# Patient Record
Sex: Female | Born: 1955 | Smoking: Former smoker
Health system: Southern US, Community
[De-identification: ages and names within clinical notes are randomized; demographics above are authoritative.]

## PROBLEM LIST (undated history)

## (undated) DIAGNOSIS — Z86718 Personal history of other venous thrombosis and embolism: Secondary | ICD-10-CM

## (undated) DIAGNOSIS — J449 Chronic obstructive pulmonary disease, unspecified: Secondary | ICD-10-CM

## (undated) DIAGNOSIS — J42 Unspecified chronic bronchitis: Secondary | ICD-10-CM

## (undated) DIAGNOSIS — J9819 Other pulmonary collapse: Secondary | ICD-10-CM

## (undated) DIAGNOSIS — Z9981 Dependence on supplemental oxygen: Secondary | ICD-10-CM

## (undated) DIAGNOSIS — I10 Essential (primary) hypertension: Secondary | ICD-10-CM

## (undated) DIAGNOSIS — N189 Chronic kidney disease, unspecified: Secondary | ICD-10-CM

## (undated) DIAGNOSIS — I509 Heart failure, unspecified: Secondary | ICD-10-CM

## (undated) DIAGNOSIS — T7840XA Allergy, unspecified, initial encounter: Secondary | ICD-10-CM

## (undated) DIAGNOSIS — M1990 Unspecified osteoarthritis, unspecified site: Secondary | ICD-10-CM

## (undated) HISTORY — DX: Allergy, unspecified, initial encounter: T78.40XA

## (undated) HISTORY — DX: Personal history of other venous thrombosis and embolism: Z86.718

## (undated) HISTORY — DX: Chronic kidney disease, unspecified: N18.9

## (undated) HISTORY — DX: Unspecified osteoarthritis, unspecified site: M19.90

## (undated) HISTORY — DX: Other pulmonary collapse: J98.19

## (undated) HISTORY — PX: TUBAL LIGATION: SHX77

## (undated) HISTORY — DX: Essential (primary) hypertension: I10

## (undated) HISTORY — DX: Dependence on supplemental oxygen: Z99.81

## (undated) HISTORY — DX: Heart failure, unspecified: I50.9

## (undated) HISTORY — DX: Chronic obstructive pulmonary disease, unspecified: J44.9

## (undated) HISTORY — DX: Unspecified chronic bronchitis: J42

---

## 1974-12-03 DIAGNOSIS — Z86718 Personal history of other venous thrombosis and embolism: Secondary | ICD-10-CM

## 1974-12-03 HISTORY — DX: Personal history of other venous thrombosis and embolism: Z86.718

## 2006-08-21 ENCOUNTER — Ambulatory Visit: Payer: Self-pay

## 2008-11-21 ENCOUNTER — Emergency Department: Payer: Self-pay | Admitting: Unknown Physician Specialty

## 2012-05-04 ENCOUNTER — Emergency Department: Payer: Self-pay | Admitting: Emergency Medicine

## 2012-05-04 LAB — CBC
HGB: 14.7 g/dL (ref 12.0–16.0)
MCHC: 33.6 g/dL (ref 32.0–36.0)
MCV: 97 fL (ref 80–100)
Platelet: 215 10*3/uL (ref 150–440)
RBC: 4.51 10*6/uL (ref 3.80–5.20)

## 2012-05-04 LAB — BASIC METABOLIC PANEL
Calcium, Total: 9 mg/dL (ref 8.5–10.1)
Chloride: 102 mmol/L (ref 98–107)
Creatinine: 0.62 mg/dL (ref 0.60–1.30)
EGFR (African American): >60
EGFR (Non-African Amer.): >60
Osmolality: 274 (ref 275–301)
Potassium: 4.6 mmol/L (ref 3.5–5.1)
Sodium: 137 mmol/L (ref 136–145)

## 2012-05-04 LAB — TROPONIN I: Troponin-I: <0.02 ng/mL

## 2012-05-07 DIAGNOSIS — R000 Tachycardia, unspecified: Secondary | ICD-10-CM

## 2012-05-08 ENCOUNTER — Telehealth: Payer: Self-pay

## 2012-05-08 NOTE — Telephone Encounter (Signed)
Attempted to reach pt re: Holter ordered in ER Unable to reach d/t all contact #'s being disconnected.

## 2012-06-13 ENCOUNTER — Ambulatory Visit (INDEPENDENT_AMBULATORY_CARE_PROVIDER_SITE_OTHER): Payer: Self-pay | Admitting: Cardiovascular Disease

## 2012-06-13 ENCOUNTER — Encounter: Payer: Self-pay | Admitting: Cardiovascular Disease

## 2012-06-13 VITALS — BP 130/80 | HR 80 | Ht 64.0 in | Wt 179.8 lb

## 2012-06-13 DIAGNOSIS — R002 Palpitations: Secondary | ICD-10-CM

## 2012-06-13 DIAGNOSIS — R079 Chest pain, unspecified: Secondary | ICD-10-CM

## 2012-06-13 DIAGNOSIS — F17200 Nicotine dependence, unspecified, uncomplicated: Secondary | ICD-10-CM | POA: Insufficient documentation

## 2012-06-13 MED ORDER — NITROGLYCERIN 0.4 MG SL SUBL: 0.4000 mg | SUBLINGUAL_TABLET | SUBLINGUAL | Status: DC | PRN

## 2012-06-13 MED ORDER — METOPROLOL TARTRATE 25 MG PO TABS: 25.0000 mg | ORAL_TABLET | Freq: Two times a day (BID) | ORAL | Status: DC

## 2012-06-13 NOTE — Assessment & Plan Note (Signed)
Holter monitor shows very short runs of SVT. She has improved symptoms with metoprolol. We have suggested she stay on the metoprolol tartrate 25 mg twice a day

## 2012-06-13 NOTE — Patient Instructions (Addendum)
You are doing well. Please continue metoprolol tartrate twice a day for palpitations Please call if you have worsening chest pain, more intense, lasting longer, more frequent, associated with sweating and dizziness  Please call us if you have new issues that need to be addressed before your next appt.  Your physician wants you to follow-up in: 6 months.  You will receive a reminder letter in the mail two months in advance. If you don't receive a letter, please call our office to schedule the follow-up appointment.

## 2012-06-13 NOTE — Assessment & Plan Note (Signed)
We have encouraged her to continue to work on weaning her cigarettes and smoking cessation. She will continue to work on this and does not want any assistance with chantix.  

## 2012-06-13 NOTE — Progress Notes (Signed)
Patient ID: Kellie Morris, female    DOB: 01/03/1956, 56 y.o.   MRN: [id number]  HPI Comments: Kellie Morris is a 56 year old woman with a long history of smoking from age 3 and continues to smoke one pack per day with no primary care physician who presented to the emergency room June 2 with palpitations and chest pain at rest. Workup was essentially negative with normal EKG and normal cardiac enzymes. Symptoms have been ongoing for several months.  She was started on metoprolol tartrate 25 mg twice a day in the emergency room and symptoms seem to have significantly improved. She has much improved palpitations and less chest pain. She had a Holter monitor performed through the emergency room that showed normal sinus rhythm with short runs of SVT. She has run out of her metoprolol.   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 With activity, she cannot reproduce her chest discomfort. Her symptoms are described as a gripping in her chest that lasts for several seconds at a time and comes on at rest. She's never had symptoms with activity. Gripping feeling is estimated at 6/10.  EKG shows normal sinus rhythm with rate 81 beats per minute with no significant ST or T wave changes      Outpatient Encounter Prescriptions as of 06/13/2012  Medication Sig Dispense Refill  . Aspirin-Salicylamide-Caffeine (BC HEADACHE POWDER PO) Take by mouth daily.      . metoprolol tartrate (LOPRESSOR) 25 MG tablet Take 1 tablet (25 mg total) by mouth 2 (two) times daily.  180 tablet  3  . Multiple Vitamin (MULTIVITAMIN) tablet Take 1 tablet by mouth daily.      . nitroGLYCERIN (NITROSTAT) 0.4 MG SL tablet Place 1 tablet (0.4 mg total) under the tongue every 5 (five) minutes as needed for chest pain.  25 tablet  6    Review of Systems  Constitutional: Negative.   HENT: Negative.   Eyes: Negative.   Respiratory: Negative.   Cardiovascular: Positive for chest pain and palpitations.    Gastrointestinal: Negative.   Musculoskeletal: Negative.   Skin: Negative.   Neurological: Negative.   Hematological: Negative.   Psychiatric/Behavioral: Negative.   All other systems reviewed and are negative.    BP 130/80  Pulse 80  Ht 5\' 4"  (1.626 m)  Wt 179 lb 12 oz (81.534 kg)  BMI 30.85 kg/m2  Physical Exam  Nursing note and vitals reviewed. Constitutional: She is oriented to person, place, and time. She appears well-developed and well-nourished.       Smells like smoke  HENT:  Head: Normocephalic.  Nose: Nose normal.  Mouth/Throat: Oropharynx is clear and moist.  Eyes: Conjunctivae are normal. Pupils are equal, round, and reactive to light.  Neck: Normal range of motion. Neck supple. No JVD present.  Cardiovascular: Normal rate, regular rhythm, S1 normal, S2 normal, normal heart sounds and intact distal pulses.  Exam reveals no gallop and no friction rub.   No murmur heard. Pulmonary/Chest: Effort normal and breath sounds normal. No respiratory distress. She has no wheezes. She has no rales. She exhibits no tenderness.  Abdominal: Soft. Bowel sounds are normal. She exhibits no distension. There is no tenderness.  Musculoskeletal: Normal range of motion. She exhibits no edema and no tenderness.  Lymphadenopathy:    She has no cervical adenopathy.  Neurological: She is alert and oriented to person, place, and time. Coordination normal.  Skin: Skin is warm and dry. No rash noted. No erythema.  Psychiatric: She has a normal mood and affect. Her behavior is normal. Judgment and thought content normal.         Assessment and Plan

## 2012-06-13 NOTE — Assessment & Plan Note (Signed)
Atypical type chest pain, coming on at rest. We have discussed serious treatment options with her including treadmill study ordered monitoring with medical management. She'll prefer to continue on metoprolol at this time and, since her symptoms get worse. We will continue metoprolol 25 mg twice a day. We have suggested she not add aspirin to her diet as she takes a lot of BC powders. If she has any chest pain with exertion, or diaphoresis, increasing frequency or intensity, she has been asked to call our office for further evaluation. She is certainly high risk of coronary artery disease

## 2012-07-22 ENCOUNTER — Emergency Department: Payer: Self-pay | Admitting: *Deleted

## 2013-05-07 ENCOUNTER — Ambulatory Visit (INDEPENDENT_AMBULATORY_CARE_PROVIDER_SITE_OTHER): Payer: Self-pay | Admitting: Cardiovascular Disease

## 2013-05-07 ENCOUNTER — Encounter: Payer: Self-pay | Admitting: Cardiovascular Disease

## 2013-05-07 VITALS — BP 114/78 | HR 73 | Ht 64.5 in | Wt 178.0 lb

## 2013-05-07 DIAGNOSIS — R0602 Shortness of breath: Secondary | ICD-10-CM

## 2013-05-07 DIAGNOSIS — E785 Hyperlipidemia, unspecified: Secondary | ICD-10-CM

## 2013-05-07 DIAGNOSIS — R002 Palpitations: Secondary | ICD-10-CM

## 2013-05-07 DIAGNOSIS — Z0000 Encounter for general adult medical examination without abnormal findings: Secondary | ICD-10-CM | POA: Insufficient documentation

## 2013-05-07 DIAGNOSIS — Z79899 Other long term (current) drug therapy: Secondary | ICD-10-CM

## 2013-05-07 DIAGNOSIS — F17200 Nicotine dependence, unspecified, uncomplicated: Secondary | ICD-10-CM

## 2013-05-07 MED ORDER — METOPROLOL TARTRATE 25 MG PO TABS: 25.0000 mg | ORAL_TABLET | Freq: Two times a day (BID) | ORAL | Status: DC

## 2013-05-07 NOTE — Assessment & Plan Note (Signed)
We will check her cholesterol today. Given her long smoking history, would favor aggressive lipid management. Would start a statin for high cholesterol.

## 2013-05-07 NOTE — Assessment & Plan Note (Signed)
We have encouraged her to continue to work on weaning her cigarettes and smoking cessation. She will continue to work on this and does not want any assistance with chantix.  

## 2013-05-07 NOTE — Assessment & Plan Note (Signed)
Rare palpitations. We'll continue her on current dose of metoprolol

## 2013-05-07 NOTE — Progress Notes (Signed)
Patient ID: Kellie Morris, female    DOB: 1956/02/24, 57 y.o.   MRN: [id number]  HPI Comments: Kellie Morris is a 57 year old woman with a long history of smoking from age 56 and continues to smoke one pack per day with no primary care physician who presented to the emergency room May 04 2012 with palpitations and chest pain at rest. Workup was essentially negative with normal EKG and normal cardiac enzymes. Symptoms had been ongoing for several months.  She was started on metoprolol tartrate 25 mg twice a day in the emergency room and symptoms seem to have significantly improved.  On her presentation today, she reports that she is doing well. She continues to smoke, denies any chest pain. Occasional palpitations "once in a while".  Walk a long way over the holiday weekend and was short of breath but typically she's not very active.  Prior Holter monitor performed through the emergency room that showed normal sinus rhythm with short runs of SVT.   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 Aspirin-Salicylamide-Caffeine (BC HEADACHE POWDER PO) Take by mouth daily.      . metoprolol tartrate (LOPRESSOR) 25 MG tablet Take 1 tablet (25 mg total) by mouth 2 (two) times daily.  180 tablet  3  . Multiple Vitamin (MULTIVITAMIN) tablet Take 1 tablet by mouth daily.      . nitroGLYCERIN (NITROSTAT) 0.4 MG SL tablet Place 1 tablet (0.4 mg total) under the tongue every 5 (five) minutes as needed for chest pain.  25 tablet  6   No facility-administered encounter medications on file as of 05/07/2013.    Review of Systems  Constitutional: Negative.   HENT: Negative.   Eyes: Negative.   Respiratory: Positive for shortness of breath.   Cardiovascular:  Positive for palpitations.  Gastrointestinal: Negative.   Musculoskeletal: Negative.   Skin: Negative.   Neurological: Negative.   Psychiatric/Behavioral: Negative.   All other systems reviewed and are negative.    BP 114/78  Pulse 73  Ht 5' 4.5" (1.638 m)  Wt 178 lb (80.74 kg)  BMI 30.09 kg/m2  Physical Exam  Nursing note and vitals reviewed. Constitutional: She is oriented to person, place, and time. She appears well-developed and well-nourished.  Smells like smoke  HENT:  Head: Normocephalic.  Nose: Nose normal.  Mouth/Throat: Oropharynx is clear and moist.  Eyes: Conjunctivae are normal. Pupils are equal, round, and reactive to light.  Neck: Normal range of motion. Neck supple. No JVD present.  Cardiovascular: Normal rate, regular rhythm, S1 normal, S2 normal, normal heart sounds and intact distal pulses.  Exam reveals no gallop and no friction rub.   No murmur heard. Pulmonary/Chest: Effort normal. No respiratory distress. She has decreased breath sounds. She has no wheezes. She has no rales. She exhibits no tenderness.  Abdominal: Soft. Bowel sounds are normal. She exhibits no distension. There is no tenderness.  Musculoskeletal: Normal range of motion. She exhibits no edema and no tenderness.  Lymphadenopathy:    She has no cervical adenopathy.  Neurological: She is alert and oriented to person, place, and time. Coordination normal.  Skin: Skin is warm and dry. No rash noted. No erythema.  Psychiatric: She has a  normal mood and affect. Her behavior is normal. Judgment and thought content normal.    Assessment and Plan

## 2013-05-07 NOTE — Patient Instructions (Addendum)
You are doing well. No medication changes were made.  Please call us if you have new issues that need to be addressed before your next appt.  Your physician wants you to follow-up in: 12 months.  You will receive a reminder letter in the mail two months in advance. If you don't receive a letter, please call our office to schedule the follow-up appointment. 

## 2013-05-08 LAB — LIPID PANEL
Chol/HDL Ratio: 2.7 ratio units (ref 0.0–4.4)
Cholesterol, Total: 172 mg/dL (ref 100–199)
LDL Calculated: 92 mg/dL (ref 0–99)

## 2013-05-08 LAB — HEPATIC FUNCTION PANEL
AST: 22 IU/L (ref 0–40)
Albumin: 4.6 g/dL (ref 3.5–5.5)
Total Bilirubin: 0.3 mg/dL (ref 0.0–1.2)
Total Protein: 7.3 g/dL (ref 6.0–8.5)

## 2013-05-18 ENCOUNTER — Other Ambulatory Visit: Payer: Self-pay

## 2013-05-18 MED ORDER — SIMVASTATIN 20 MG PO TABS: 20.0000 mg | ORAL_TABLET | Freq: Every day | ORAL | Status: DC

## 2013-05-18 NOTE — Telephone Encounter (Signed)
Refill sent for simvastatin 20 mg take 1/2 tablet for the first two weeks, then increase to the whole 20 mg tablet.

## 2013-05-21 ENCOUNTER — Telehealth: Payer: Self-pay | Admitting: *Deleted

## 2013-05-21 MED ORDER — SIMVASTATIN 20 MG PO TABS: 20.0000 mg | ORAL_TABLET | Freq: Every day | ORAL | Status: DC

## 2013-05-21 NOTE — Telephone Encounter (Signed)
She needs the generic form Simvastin called into [facility]

## 2013-05-21 NOTE — Telephone Encounter (Signed)
Done

## 2013-06-04 ENCOUNTER — Other Ambulatory Visit: Payer: Self-pay | Admitting: *Deleted

## 2013-06-04 MED ORDER — NITROGLYCERIN 0.4 MG SL SUBL: 0.4000 mg | SUBLINGUAL_TABLET | SUBLINGUAL | Status: AC | PRN

## 2013-06-04 MED ORDER — SIMVASTATIN 20 MG PO TABS: 20.0000 mg | ORAL_TABLET | Freq: Every day | ORAL | Status: DC

## 2013-06-04 NOTE — Telephone Encounter (Signed)
Patient wants generic

## 2014-05-07 ENCOUNTER — Encounter: Payer: Self-pay | Admitting: Cardiovascular Disease

## 2014-05-07 ENCOUNTER — Ambulatory Visit (INDEPENDENT_AMBULATORY_CARE_PROVIDER_SITE_OTHER): Payer: Self-pay | Admitting: Cardiovascular Disease

## 2014-05-07 VITALS — BP 134/82 | HR 61 | Ht 64.0 in | Wt 166.5 lb

## 2014-05-07 DIAGNOSIS — R002 Palpitations: Secondary | ICD-10-CM

## 2014-05-07 DIAGNOSIS — IMO0001 Reserved for inherently not codable concepts without codable children: Secondary | ICD-10-CM

## 2014-05-07 DIAGNOSIS — F17200 Nicotine dependence, unspecified, uncomplicated: Secondary | ICD-10-CM

## 2014-05-07 DIAGNOSIS — R079 Chest pain, unspecified: Secondary | ICD-10-CM

## 2014-05-07 DIAGNOSIS — R0602 Shortness of breath: Secondary | ICD-10-CM

## 2014-05-07 MED ORDER — LOVASTATIN 20 MG PO TABS: 20.0000 mg | ORAL_TABLET | Freq: Every day | ORAL | Status: AC

## 2014-05-07 MED ORDER — METOPROLOL TARTRATE 25 MG PO TABS: 25.0000 mg | ORAL_TABLET | Freq: Two times a day (BID) | ORAL | Status: DC

## 2014-05-07 NOTE — Progress Notes (Signed)
   Patient ID: Kellie Morris, female    DOB: 1956/11/07, 58 y.o.   MRN: [id number]  HPI Comments: Kellie Morris is a 58 year old woman with a long history of smoking from age 25 and continues to smoke one pack per day who presented to the emergency room May 04 2012 with palpitations and chest pain at rest. Workup was essentially negative with normal EKG and normal cardiac enzymes. Symptoms had been ongoing for several months. She was started on metoprolol tartrate 25 mg twice a day in the emergency room and symptoms seem to have significantly improved.  On her presentation today, she reports that she is doing well. She continues to smoke, denies any chest pain.   Prior Holter monitor performed through the emergency room that showed normal sinus rhythm with short runs of SVT.   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   EKG shows normal sinus rhythm with rate 61 beats per minute with no significant ST or T wave changes      Outpatient Encounter Prescriptions as of 05/07/2014  Medication Sig  . Aspirin-Salicylamide-Caffeine (BC HEADACHE POWDER PO) Take by mouth daily.  . metoprolol tartrate (LOPRESSOR) 25 MG tablet Take 1 tablet (25 mg total) by mouth 2 (two) times daily.  . Multiple Vitamin (MULTIVITAMIN) tablet Take 1 tablet by mouth daily.  . nitroGLYCERIN (NITROSTAT) 0.4 MG SL tablet Place 1 tablet (0.4 mg total) under the tongue every 5 (five) minutes as needed for chest pain.   Review of Systems  Constitutional: Negative.   HENT: Negative.   Eyes: Negative.   Respiratory: Positive for shortness of breath.   Cardiovascular: Negative.   Gastrointestinal: Negative.   Endocrine: Negative.   Musculoskeletal: Negative.   Skin: Negative.   Allergic/Immunologic: Negative.   Neurological: Negative.   Hematological: Negative.   Psychiatric/Behavioral: Negative.   All other systems reviewed and are negative.   BP 134/82  Pulse 61  Ht 5\' 4"  (1.626 m)  Wt 166 lb  8 oz (75.524 kg)  BMI 28.57 kg/m2  Physical Exam  Nursing note and vitals reviewed. Constitutional: She is oriented to person, place, and time. She appears well-developed and well-nourished.  Smells like smoke  HENT:  Head: Normocephalic.  Nose: Nose normal.  Mouth/Throat: Oropharynx is clear and moist.  Eyes: Conjunctivae are normal. Pupils are equal, round, and reactive to light.  Neck: Normal range of motion. Neck supple. No JVD present.  Cardiovascular: Normal rate, regular rhythm, S1 normal, S2 normal, normal heart sounds and intact distal pulses.  Exam reveals no gallop and no friction rub.   No murmur heard. Pulmonary/Chest: Effort normal. No respiratory distress. She has decreased breath sounds. She has no wheezes. She has no rales. She exhibits no tenderness.  Abdominal: Soft. Bowel sounds are normal. She exhibits no distension. There is no tenderness.  Musculoskeletal: Normal range of motion. She exhibits no edema and no tenderness.  Lymphadenopathy:    She has no cervical adenopathy.  Neurological: She is alert and oriented to person, place, and time. Coordination normal.  Skin: Skin is warm and dry. No rash noted. No erythema.  Psychiatric: She has a normal mood and affect. Her behavior is normal. Judgment and thought content normal.    Assessment and Plan

## 2014-05-07 NOTE — Assessment & Plan Note (Signed)
No further episodes of chest pain. No further workup at this time

## 2014-05-07 NOTE — Patient Instructions (Addendum)
You are doing well. Please start lovastatin, Cut in 1/2 daily  Please call us if you have new issues that need to be addressed before your next appt.  Your physician wants you to follow-up in: 12 months.  You will receive a reminder letter in the mail two months in advance. If you don't receive a letter, please call our office to schedule the follow-up appointment.

## 2014-05-07 NOTE — Assessment & Plan Note (Signed)
Sx resolved with metoprolol. She would like to continue on metoprolol and she is doing so well

## 2014-05-07 NOTE — Assessment & Plan Note (Signed)
We have encouraged her to continue to work on weaning her cigarettes and smoking cessation. She will continue to work on this and does not want any assistance with chantix.  

## 2015-03-26 DIAGNOSIS — F1010 Alcohol abuse, uncomplicated: Secondary | ICD-10-CM | POA: Insufficient documentation

## 2015-03-26 DIAGNOSIS — I4891 Unspecified atrial fibrillation: Secondary | ICD-10-CM | POA: Insufficient documentation

## 2015-04-02 DIAGNOSIS — E785 Hyperlipidemia, unspecified: Secondary | ICD-10-CM | POA: Insufficient documentation

## 2015-04-02 DIAGNOSIS — J9621 Acute and chronic respiratory failure with hypoxia: Secondary | ICD-10-CM | POA: Insufficient documentation

## 2015-04-02 DIAGNOSIS — J449 Chronic obstructive pulmonary disease, unspecified: Secondary | ICD-10-CM | POA: Insufficient documentation

## 2015-09-26 ENCOUNTER — Telehealth: Payer: Self-pay | Admitting: Cardiovascular Disease

## 2015-09-26 NOTE — Telephone Encounter (Signed)
Patient was treated at [facility] for afib and has been fu with them.  Does not wish to schedule OD fu with gollan   Deleting recall.

## 2015-12-29 DIAGNOSIS — G2581 Restless legs syndrome: Secondary | ICD-10-CM | POA: Insufficient documentation

## 2015-12-29 DIAGNOSIS — G4700 Insomnia, unspecified: Secondary | ICD-10-CM | POA: Insufficient documentation

## 2016-06-13 ENCOUNTER — Other Ambulatory Visit: Payer: Self-pay | Admitting: Cardiovascular Disease

## 2017-04-19 DIAGNOSIS — E785 Hyperlipidemia, unspecified: Secondary | ICD-10-CM | POA: Insufficient documentation

## 2017-12-04 DIAGNOSIS — R918 Other nonspecific abnormal finding of lung field: Secondary | ICD-10-CM | POA: Insufficient documentation

## 2017-12-14 ENCOUNTER — Other Ambulatory Visit: Payer: Self-pay

## 2017-12-14 ENCOUNTER — Ambulatory Visit: Payer: Self-pay

## 2017-12-14 ENCOUNTER — Ambulatory Visit
Admission: EM | Admit: 2017-12-14 | Discharge: 2017-12-14 | Disposition: A | Discharge status: Home / Self Care | Payer: Self-pay | Attending: Family Medicine | Admitting: Family Medicine

## 2017-12-14 DIAGNOSIS — R062 Wheezing: Secondary | ICD-10-CM

## 2017-12-14 DIAGNOSIS — J45901 Unspecified asthma with (acute) exacerbation: Secondary | ICD-10-CM

## 2017-12-14 DIAGNOSIS — B9789 Other viral agents as the cause of diseases classified elsewhere: Secondary | ICD-10-CM

## 2017-12-14 DIAGNOSIS — J069 Acute upper respiratory infection, unspecified: Secondary | ICD-10-CM

## 2017-12-14 DIAGNOSIS — R05 Cough: Secondary | ICD-10-CM

## 2017-12-14 MED ORDER — AZITHROMYCIN 250 MG PO TABS: 250.0000 mg | ORAL_TABLET | Freq: Every day | ORAL | 0 refills | Status: DC

## 2017-12-14 MED ORDER — IPRATROPIUM-ALBUTEROL 0.5-2.5 (3) MG/3ML IN SOLN
3.0000 mL | Freq: Four times a day (QID) | RESPIRATORY_TRACT | Status: DC
  Administered 2017-12-14: 3 mL via RESPIRATORY_TRACT

## 2017-12-14 MED ORDER — METHYLPREDNISOLONE SODIUM SUCC 125 MG IJ SOLR
125.0000 mg | Freq: Once | INTRAMUSCULAR | Status: AC
  Administered 2017-12-14: 125 mg via INTRAMUSCULAR

## 2017-12-14 MED ORDER — PREDNISONE 20 MG PO TABS: 40.0000 mg | ORAL_TABLET | Freq: Every day | ORAL | 0 refills | Status: DC

## 2017-12-14 NOTE — ED Triage Notes (Signed)
Patient c/o cough and congestion x 3 days.Patient states when she coughs her chest and ribs hurt.

## 2017-12-14 NOTE — ED Provider Notes (Signed)
[facility]    CSN: [id number] Arrival date & time: 12/14/17  1139     History   Chief Complaint Chief Complaint  Patient presents with  . Cough    HPI Kellie Morris is a 62 y.o. female presents to the urgent care facility for evaluation of wheezing, cough, congestion.  Symptoms been present for 4-5 days.  She has had some intermittent fevers.  She has a history of COPD.  Normally takes Spiriva and occasional albuterol inhaler as needed.  She has tried albuterol this morning with minimal relief.  Coughing is often productive.  She has had low-grade intermittent subjective fevers.  She denies any shortness of breath, chest pain, abdominal pain, nausea vomiting or diarrhea.  Patient has not been taking medications for symptoms.  HPI  Past Medical History:  Diagnosis Date  . Chronic bronchitis ([place])   . History of DVT (deep vein thrombosis) 1976  . Hypertension     Patient Active Problem List   Diagnosis Date Noted  . Visit for preventive health examination 05/07/2013  . Chest pain 06/13/2012  . Palpitations 06/13/2012  . Smoking 06/13/2012    Past Surgical History:  Procedure Laterality Date  . TUBAL LIGATION      OB History    No data available       Home Medications    Prior to Admission medications   Medication Sig Start Date End Date Taking? Authorizing Provider  Aspirin-Salicylamide-Caffeine (BC HEADACHE POWDER PO) Take by mouth daily.   Yes [provider]  gabapentin (NEURONTIN) 100 MG capsule Take 100 mg by mouth 3 (three) times daily.   Yes [provider]  lisinopril (PRINIVIL,ZESTRIL) 10 MG tablet Take 10 mg by mouth daily.   Yes [provider]  lovastatin (MEVACOR) 20 MG tablet Take 1 tablet (20 mg total) by mouth at bedtime. 05/07/14  Yes Minna Merritts, MD  metoprolol tartrate (LOPRESSOR) 25 MG tablet Take 1 tablet (25 mg total) by mouth 2 (two) times daily. 05/07/14  Yes Minna Merritts, MD  Multiple Vitamin  (MULTIVITAMIN) tablet Take 1 tablet by mouth daily.   Yes [provider]  nitroGLYCERIN (NITROSTAT) 0.4 MG SL tablet Place 1 tablet (0.4 mg total) under the tongue every 5 (five) minutes as needed for chest pain. 06/04/13 12/14/17 Yes Gollan, Kathlene November, MD  azithromycin (ZITHROMAX) 250 MG tablet Take 1 tablet (250 mg total) by mouth daily. Take first 2 tablets together, then 1 every day until finished. 12/14/17   Duanne Guess, PA-C  predniSONE (DELTASONE) 20 MG tablet Take 2 tablets (40 mg total) by mouth daily. 12/14/17   Duanne Guess, PA-C    Family History Family History  Problem Relation Age of Onset  . Heart attack Father   . Heart attack Brother     Social History Social History   Tobacco Use  . Smoking status: Current Every Day Smoker    Packs/day: 1.00    Years: 40.00    Pack years: 40.00    Types: Cigarettes  . Smokeless tobacco: Never Used  Substance Use Topics  . Alcohol use: Yes    Comment: daily  . Drug use: No     Allergies   Codeine   Review of Systems Review of Systems  Constitutional: Negative for fever.  HENT: Positive for congestion. Negative for sore throat and trouble swallowing.   Respiratory: Positive for cough and wheezing. Negative for shortness of breath.   Cardiovascular: Negative for chest pain.  Gastrointestinal: Negative for abdominal pain, diarrhea and vomiting.  Genitourinary: Negative for difficulty urinating, dysuria and urgency.  Musculoskeletal: Negative for back pain and myalgias.  Skin: Negative for rash.  Neurological: Negative for dizziness and headaches.     Physical Exam Triage Vital Signs ED Triage Vitals  Enc Vitals Group     BP 12/14/17 1200 114/67     Pulse Rate 12/14/17 1200 96     Resp --      Temp 12/14/17 1200 97.7 F (36.5 C)     Temp Source 12/14/17 1200 Oral     SpO2 12/14/17 1200 93 %     Weight 12/14/17 1200 172 lb (78 kg)     Height 12/14/17 1200 5\' 4"  (1.626 m)     Head Circumference --       Peak Flow --      Pain Score 12/14/17 1201 5     Pain Loc --      Pain Edu? --      Excl. in [place]? --    No data found.  Updated Vital Signs BP 114/67 (BP Location: Left Arm)   Pulse 96   Temp 97.7 F (36.5 C) (Oral)   Ht 5\' 4"  (1.626 m)   Wt 172 lb (78 kg)   SpO2 93%   BMI 29.52 kg/m   Visual Acuity Right Eye Distance:   Left Eye Distance:   Bilateral Distance:    Right Eye Near:   Left Eye Near:    Bilateral Near:     Physical Exam  Constitutional: She is oriented to person, place, and time. She appears well-developed and well-nourished.  HENT:  Head: Normocephalic and atraumatic.  Right Ear: External ear normal.  Left Ear: External ear normal.  Mouth/Throat: Oropharynx is clear and moist. No oropharyngeal exudate.  Eyes: Conjunctivae are normal.  Neck: Normal range of motion.  Cardiovascular: Normal rate.  Pulmonary/Chest: Effort normal. No respiratory distress. She has wheezes. She has no rales. She exhibits no tenderness.  No retractions.  No signs of respiratory distress.  Slight bilateral expiratory wheeze moderate bilateral wheezing.  Abdominal: Soft. She exhibits no distension. There is no tenderness.  Musculoskeletal: Normal range of motion.  Neurological: She is alert and oriented to person, place, and time.  Skin: Skin is warm. No rash noted.  Psychiatric: She has a normal mood and affect. Her behavior is normal. Thought content normal.     UC Treatments / Results  Labs (all labs ordered are listed, but only abnormal results are displayed) Labs Reviewed - No data to display  EKG  EKG Interpretation None       Radiology Dg Chest 2 View  Result Date: 12/14/2017 CLINICAL DATA:  Cough, wheezing EXAM: CHEST  2 VIEW COMPARISON:  05/04/2012 FINDINGS: Hyperinflation. Mild cardiomegaly. Lungs clear. No effusions or acute bony abnormality. IMPRESSION: Cardiomegaly, hyperinflation.  No active disease. Electronically Signed   By: Rolm Baptise M.D.    On: 12/14/2017 12:49    Procedures Procedures (including critical care time)  Medications Ordered in UC Medications  ipratropium-albuterol (DUONEB) 0.5-2.5 (3) MG/3ML nebulizer solution 3 mL (3 mLs Nebulization Given 12/14/17 1220)  methylPREDNISolone sodium succinate (SOLU-MEDROL) 125 mg/2 mL injection 125 mg (125 mg Intramuscular Given 12/14/17 1219)     Initial Impression / Assessment and Plan / UC Course  I have reviewed the triage vital signs and the nursing notes.  Pertinent labs & imaging results that were available during my care of the patient were reviewed  by me and considered in my medical decision making (see chart for details).     62 year old female with upper respiratory infection with COPD exacerbation.  She is given DuoNeb treatment, 125 mg Solu-Medrol IM which improved wheezing and air movement significant.  Chest x-ray showed evidence of pulmonary infiltrate.  She is given prescription for prednisone, will continue with over-the-counter medications as well as albuterol and Spiriva as prescribed.  She is given a prescription for azithromycin, I recommend she not take this medication at this time.  If patient's symptoms are still persistent in 3-4 days with no improvement would recommend she start antibiotic at this time.  Patient educated on signs and symptoms to return to the clinic for.  Final Clinical Impressions(s) / UC Diagnoses   Final diagnoses:  Viral upper respiratory tract infection  Moderate asthma with exacerbation, unspecified whether persistent    ED Discharge Orders        Ordered    predniSONE (DELTASONE) 20 MG tablet  Daily     12/14/17 1301    azithromycin (ZITHROMAX) 250 MG tablet  Daily     12/14/17 1301         Duanne Guess, [place] 12/14/17 1303

## 2017-12-14 NOTE — Discharge Instructions (Signed)
Please take medications as prescribed.  Return to the urgent care or emergency department for any shortness of breath, fevers, worsening cough or urgent changes in health.

## 2017-12-17 ENCOUNTER — Telehealth: Payer: Self-pay | Admitting: Emergency Medicine

## 2017-12-17 NOTE — Telephone Encounter (Signed)
Lest message to follow-up with patient regarding her recent visit at [facility].

## 2018-02-22 DIAGNOSIS — I519 Heart disease, unspecified: Secondary | ICD-10-CM | POA: Insufficient documentation

## 2018-03-05 DIAGNOSIS — Q2112 Patent foramen ovale: Secondary | ICD-10-CM | POA: Insufficient documentation

## 2018-03-19 DIAGNOSIS — I272 Pulmonary hypertension, unspecified: Secondary | ICD-10-CM | POA: Insufficient documentation

## 2018-05-23 ENCOUNTER — Ambulatory Visit (INDEPENDENT_AMBULATORY_CARE_PROVIDER_SITE_OTHER): Payer: Self-pay

## 2018-05-23 ENCOUNTER — Ambulatory Visit
Admission: EM | Admit: 2018-05-23 | Discharge: 2018-05-23 | Disposition: A | Discharge status: Home / Self Care | Payer: Self-pay | Attending: Emergency Medicine | Admitting: Emergency Medicine

## 2018-05-23 ENCOUNTER — Encounter: Payer: Self-pay | Admitting: Emergency Medicine

## 2018-05-23 ENCOUNTER — Other Ambulatory Visit: Payer: Self-pay

## 2018-05-23 DIAGNOSIS — M79671 Pain in right foot: Secondary | ICD-10-CM

## 2018-05-23 DIAGNOSIS — M25571 Pain in right ankle and joints of right foot: Secondary | ICD-10-CM

## 2018-05-23 DIAGNOSIS — M19072 Primary osteoarthritis, left ankle and foot: Secondary | ICD-10-CM

## 2018-05-23 MED ORDER — PREDNISONE 10 MG (21) PO TBPK: ORAL_TABLET | ORAL | 0 refills | Status: DC

## 2018-05-23 NOTE — ED Provider Notes (Signed)
[facility]    CSN: [id number] Arrival date & time: 05/23/18  4098     History   Chief Complaint Chief Complaint  Patient presents with  . Ankle Pain    right  . Foot Pain    right    HPI Kellie Morris is a 62 y.o. female.   62 year old female presents with right foot and ankle pain that started about 3 days ago. No distinct injury or fall. Started on top of right foot and now most of the pain is located on right lateral malleolus area. Area is swollen and tender. Unable to fully bear weight. Has tried applying ice with minimal relief. She has not taken any OTC medications for pain. No previous injury to area. No history of gout but has had a brief period of similar symptoms that resolved on own a few months ago. Other chronic health issues include HTN, A fib, hx of DVT, COPD, hyperlipidemia and currently on Metoprolol, Lisinopril, Lovastatin, Lasix, Neurontin and Xarelto as well as Atrovent and Nitroglycerin prn.   The history is provided by the patient.    Past Medical History:  Diagnosis Date  . Chronic bronchitis ([place])   . History of DVT (deep vein thrombosis) 1976  . Hypertension     Patient Active Problem List   Diagnosis Date Noted  . Visit for preventive health examination 05/07/2013  . Chest pain 06/13/2012  . Palpitations 06/13/2012  . Smoking 06/13/2012    Past Surgical History:  Procedure Laterality Date  . TUBAL LIGATION      OB History   None      Home Medications    Prior to Admission medications   Medication Sig Start Date End Date Taking? Authorizing Provider  furosemide (LASIX) 20 MG tablet Take by mouth. 08/06/17 06/14/18 Yes [provider]  lisinopril (PRINIVIL,ZESTRIL) 10 MG tablet Take 10 mg by mouth daily.   Yes [provider]  lovastatin (MEVACOR) 20 MG tablet Take 1 tablet (20 mg total) by mouth at bedtime. 05/07/14  Yes Minna Merritts, MD  metoprolol tartrate (LOPRESSOR) 25 MG tablet Take 1 tablet (25 mg  total) by mouth 2 (two) times daily. 05/07/14  Yes Minna Merritts, MD  Multiple Vitamin (MULTIVITAMIN) tablet Take 1 tablet by mouth daily.   Yes [provider]  rivaroxaban (XARELTO) 20 MG TABS tablet Take by mouth. 05/15/18 06/14/18 Yes [provider]  Aspirin-Salicylamide-Caffeine (BC HEADACHE POWDER PO) Take by mouth daily.    [provider]  gabapentin (NEURONTIN) 100 MG capsule Take 100 mg by mouth 3 (three) times daily.    [provider]  ipratropium (ATROVENT HFA) 17 MCG/ACT inhaler Inhale into the lungs.    [provider]  nitroGLYCERIN (NITROSTAT) 0.4 MG SL tablet Place 1 tablet (0.4 mg total) under the tongue every 5 (five) minutes as needed for chest pain. 06/04/13 12/14/17  Minna Merritts, MD  predniSONE (STERAPRED UNI-PAK 21 TAB) 10 MG (21) TBPK tablet Take 6 tabs by mouth daily for 2 days, then decrease by 1 tablet every 2 days until finished on day 12 05/23/18   Jennel Mara, Nicholes Stairs, NP    Family History Family History  Problem Relation Age of Onset  . Heart attack Father   . Heart attack Brother     Social History Social History   Tobacco Use  . Smoking status: Former Smoker    Packs/day: 1.00    Years: 40.00    Pack years:  40.00    Types: Cigarettes  . Smokeless tobacco: Never Used  Substance Use Topics  . Alcohol use: Yes    Comment: daily  . Drug use: No     Allergies   Codeine   Review of Systems Review of Systems  Constitutional: Negative for activity change, appetite change, chills, fatigue and fever.  Respiratory: Negative for cough, chest tightness, shortness of breath and wheezing.   Cardiovascular: Negative for chest pain and leg swelling.  Gastrointestinal: Negative for nausea and vomiting.  Musculoskeletal: Positive for arthralgias, gait problem and joint swelling. Negative for myalgias.  Skin: Negative for color change, rash and wound.  Neurological: Negative for dizziness, tremors, seizures,  syncope, weakness, light-headedness, numbness and headaches.  Hematological: Negative for adenopathy. Bruises/bleeds easily.  Psychiatric/Behavioral: Negative.      Physical Exam Triage Vital Signs ED Triage Vitals  Enc Vitals Group     BP 05/23/18 1029 114/80     Pulse Rate 05/23/18 1029 86     Resp 05/23/18 1029 16     Temp 05/23/18 1029 97.9 F (36.6 C)     Temp Source 05/23/18 1029 Oral     SpO2 05/23/18 1029 96 %     Weight 05/23/18 1026 182 lb (82.6 kg)     Height 05/23/18 1026 5\' 4"  (1.626 m)     Head Circumference --      Peak Flow --      Pain Score 05/23/18 1025 8     Pain Loc --      Pain Edu? --      Excl. in [place]? --    No data found.  Updated Vital Signs BP 114/80 (BP Location: Left Arm)   Pulse 86   Temp 97.9 F (36.6 C) (Oral)   Resp 16   Ht 5\' 4"  (1.626 m)   Wt 182 lb (82.6 kg)   SpO2 96%   BMI 31.24 kg/m   Visual Acuity Right Eye Distance:   Left Eye Distance:   Bilateral Distance:    Right Eye Near:   Left Eye Near:    Bilateral Near:     Physical Exam  Constitutional: She is oriented to person, place, and time. Vital signs are normal. She appears well-developed and well-nourished. She is cooperative. No distress.  HENT:  Head: Normocephalic and atraumatic.  Neck: Normal range of motion.  Cardiovascular: Normal rate.  Pulmonary/Chest: Effort normal.  Musculoskeletal: She exhibits tenderness.       Right ankle: She exhibits decreased range of motion and swelling. She exhibits no ecchymosis and normal pulse. Tenderness. Lateral malleolus tenderness found. Achilles tendon normal.       Right foot: There is decreased range of motion and tenderness. There is no swelling, normal capillary refill, no crepitus, no deformity and no laceration.       Feet:  Decreased range of motion of right ankle/foot, especially with flexion and rotation. Very tender over lateral malleolus. Swollen but no bruising present. No redness. Also tender along mid to  proximal metatarsals. No swelling or redness present. Good pulses and capillary refill. No numbness. No neuro deficits present.   Neurological: She is alert and oriented to person, place, and time.  Skin: Skin is warm, dry and intact. Capillary refill takes less than 2 seconds. No bruising, no ecchymosis and no petechiae noted. No erythema.  Psychiatric: She has a normal mood and affect. Her behavior is normal. Judgment and thought content normal.  Vitals reviewed.    UC Treatments /  Results  Labs (all labs ordered are listed, but only abnormal results are displayed) Labs Reviewed - No data to display  EKG None  Radiology Dg Ankle Complete Right  Result Date: 05/23/2018 CLINICAL DATA:  Ankle pain. EXAM: RIGHT ANKLE - COMPLETE 3+ VIEW COMPARISON:  05/23/2018. FINDINGS: Diffuse soft tissue swelling. Diffuse degenerative change. No acute bony abnormality. No evidence of fracture dislocation. IMPRESSION: Diffuse degenerative change.  No acute abnormality. Electronically Signed   By: Marcello Moores  Register   On: 05/23/2018 11:50   Dg Foot Complete Right  Result Date: 05/23/2018 CLINICAL DATA:  Right foot pain.  No recent prior. EXAM: RIGHT FOOT COMPLETE - 3+ VIEW FINDINGS: No acute bony or joint abnormality identified. No evidence of fracture or dislocation. Degenerative changes first MTP joint. IMPRESSION: Degenerative changes first MTP joint. No acute bony or joint abnormality. Electronically Signed   By: Marcello Moores  Register   On: 05/23/2018 11:49    Procedures Procedures (including critical care time)  Medications Ordered in UC Medications - No data to display  Initial Impression / Assessment and Plan / UC Course  I have reviewed the triage vital signs and the nursing notes.  Pertinent labs & imaging results that were available during my care of the patient were reviewed by me and considered in my medical decision making (see chart for details).    Reviewed x-ray findings with patient- no  acute fracture but degenerative changes (arthritis) present. No signs of DVT. Since patient on Xarelto, anti-inflammatory medication is contraindicated. Patient has tolerated Prednisone in the past. Will trial Prednisone 10mg  12 day taper as directed. Wear ace wrap for support. Elevate foot as much as possible. Apply warm compresses to area for comfort. May also take Tylenol 1000mg  every 8 hours as needed for pain. Follow-up with an Orthopedic ([facility]) next week if not improving.   Final Clinical Impressions(s) / UC Diagnoses   Final diagnoses:  Acute right ankle pain  Right foot pain  Degenerative joint disease of ankle and foot, left     Discharge Instructions     Recommend start Prednisone 10mg  tablets- take 6 tablets today and tomorrow and then decrease by 1 tablet every 2 days until finished on day 12. Recommend apply warm compresses to area for comfort. May wear ace wrap for support. Follow-up with an Orthopedic next week if pain does not improve.     ED Prescriptions    Medication Sig Dispense Auth. Provider   predniSONE (STERAPRED UNI-PAK 21 TAB) 10 MG (21) TBPK tablet Take 6 tabs by mouth daily for 2 days, then decrease by 1 tablet every 2 days until finished on day 12 42 tablet Aaren Krog, Nicholes Stairs, NP     Controlled Substance Prescriptions [place] Controlled Substance Registry consulted? Not Applicable   Katy Apo, NP 05/23/18 1621

## 2018-05-23 NOTE — Discharge Instructions (Addendum)
Recommend start Prednisone 10mg  tablets- take 6 tablets today and tomorrow and then decrease by 1 tablet every 2 days until finished on day 12. Recommend apply warm compresses to area for comfort. May wear ace wrap for support. Follow-up with an Orthopedic next week if pain does not improve.

## 2018-05-23 NOTE — ED Triage Notes (Signed)
Patient c/o pain in her right foot and ankle that started 3 days ago.  Patient denies injury or fall.

## 2019-02-21 ENCOUNTER — Ambulatory Visit (INDEPENDENT_AMBULATORY_CARE_PROVIDER_SITE_OTHER): Payer: Self-pay

## 2019-02-21 ENCOUNTER — Other Ambulatory Visit: Payer: Self-pay

## 2019-02-21 ENCOUNTER — Ambulatory Visit
Admission: EM | Admit: 2019-02-21 | Discharge: 2019-02-21 | Disposition: A | Discharge status: Home / Self Care | Payer: Self-pay | Attending: Family Medicine | Admitting: Family Medicine

## 2019-02-21 DIAGNOSIS — M25571 Pain in right ankle and joints of right foot: Secondary | ICD-10-CM

## 2019-02-21 DIAGNOSIS — S8391XA Sprain of unspecified site of right knee, initial encounter: Secondary | ICD-10-CM

## 2019-02-21 DIAGNOSIS — X501XXA Overexertion from prolonged static or awkward postures, initial encounter: Secondary | ICD-10-CM

## 2019-02-21 NOTE — Discharge Instructions (Addendum)
Ice. Elevate. Rest. Tylenol. Gradually increase weight bearing as tolerated.   Follow up with orthopedic this coming week for continued pain. See above to call to schedule.  Follow up with your primary care physician this week as needed. Return to Urgent care for new or worsening concerns.

## 2019-02-21 NOTE — ED Provider Notes (Signed)
[facility] ____________________________________________  Time seen: Approximately 4:22 PM  I have reviewed the triage vital signs and the nursing notes.   HISTORY  Chief Complaint No chief complaint on file.  HPI Kellie Morris is a 63 y.o. female presenting with daughter at bedside for evaluation of right lower leg pain after injury that occurred this past Wednesday.  Reports she dropped something beside her car, bent down to get it and in the process of getting up she twisted her knee into an awkward position as well as her ankle.  Reports pain since.  Has had difficulty walking, but has continued to tolerate weightbearing.  Denies paresthesias or pain radiation.  Has been applying ankle brace without much improvement.  Has been elevating and applying ice which helps some.  Denies other pain or injuries.  Reports otherwise doing well.  Denies chest pain or shortness of breath.  Physicians, [employer]: PCP    Past Medical History:  Diagnosis Date  . Chronic bronchitis ([place])   . History of DVT (deep vein thrombosis) 1976  . Hypertension     Patient Active Problem List   Diagnosis Date Noted  . Visit for preventive health examination 05/07/2013  . Chest pain 06/13/2012  . Palpitations 06/13/2012  . Smoking 06/13/2012    Past Surgical History:  Procedure Laterality Date  . TUBAL LIGATION       No current facility-administered medications for this encounter.   Current Outpatient Medications:  .  Aspirin-Salicylamide-Caffeine (BC HEADACHE POWDER PO), Take by mouth daily., Disp: , Rfl:  .  furosemide (LASIX) 20 MG tablet, Take by mouth., Disp: , Rfl:  .  gabapentin (NEURONTIN) 100 MG capsule, Take 100 mg by mouth 3 (three) times daily., Disp: , Rfl:  .  ipratropium (ATROVENT HFA) 17 MCG/ACT inhaler, Inhale into the lungs., Disp: , Rfl:  .  lisinopril (PRINIVIL,ZESTRIL) 10 MG tablet, Take 10 mg by mouth daily., Disp: , Rfl:  .  lovastatin (MEVACOR) 20 MG tablet,  Take 1 tablet (20 mg total) by mouth at bedtime., Disp: 90 tablet, Rfl: 3 .  metoprolol tartrate (LOPRESSOR) 25 MG tablet, Take 1 tablet (25 mg total) by mouth 2 (two) times daily., Disp: 180 tablet, Rfl: 3 .  Multiple Vitamin (MULTIVITAMIN) tablet, Take 1 tablet by mouth daily., Disp: , Rfl:  .  nitroGLYCERIN (NITROSTAT) 0.4 MG SL tablet, Place 1 tablet (0.4 mg total) under the tongue every 5 (five) minutes as needed for chest pain., Disp: 25 tablet, Rfl: 6 .  predniSONE (STERAPRED UNI-PAK 21 TAB) 10 MG (21) TBPK tablet, Take 6 tabs by mouth daily for 2 days, then decrease by 1 tablet every 2 days until finished on day 12, Disp: 42 tablet, Rfl: 0 .  rivaroxaban (XARELTO) 20 MG TABS tablet, Take by mouth., Disp: , Rfl:   Allergies Codeine  Family History  Problem Relation Age of Onset  . Heart attack Father   . Heart attack Brother     Social History Social History   Tobacco Use  . Smoking status: Former Smoker    Packs/day: 1.00    Years: 40.00    Pack years: 40.00    Types: Cigarettes  . Smokeless tobacco: Never Used  Substance Use Topics  . Alcohol use: Yes    Comment:  daily 1/2 pint liquor  . Drug use: No    Review of Systems Constitutional: No fever Cardiovascular: Denies chest pain. Respiratory: Denies shortness of breath. Musculoskeletal: Negative for back pain.  Positive right  leg pain. Skin: Negative for rash.   ____________________________________________   PHYSICAL EXAM:  VITAL SIGNS: ED Triage Vitals  Enc Vitals Group     BP 02/21/19 1605 95/62     Pulse Rate 02/21/19 1605 88     Resp 02/21/19 1605 20     Temp 02/21/19 1605 97.6 F (36.4 C)     Temp Source 02/21/19 1605 Oral     SpO2 02/21/19 1605 98 %     Weight 02/21/19 1606 190 lb (86.2 kg)     Height 02/21/19 1606 5\' 4"  (1.626 m)     Head Circumference --      Peak Flow --      Pain Score 02/21/19 1606 10     Pain Loc --      Pain Edu? --      Excl. in [place]? --     Constitutional: Alert  and oriented. Well appearing and in no acute distress.      Head: Normocephalic and atraumatic. Cardiovascular: Normal rate, regular rhythm. Grossly normal heart sounds.  Good peripheral circulation. Respiratory: Normal respiratory effort without tachypnea nor retractions. Breath sounds are clear and equal bilaterally. No wheezes, rales, rhonchi. Musculoskeletal:   Bilateral pedal pulses equal and easily palpated.  Gait not tested. Except: Right medial knee minimal ecchymosis noted, mild diffuse tenderness, able to fully extend as well as flex, no pain with anterior posterior drawer, no medial or lateral instability noted, full range of motion present. Except: Right distal fibula tenderness to palpation down to lateral malleolus, mild medial malleolus tenderness palpation, minimal lateral edema, no ecchymosis, pain with ankle range of motion, minimal dorsal midfoot tenderness to palpation, right lower extremity otherwise nontender.  Right lower foot normal distal sensation and capillary refill. Neurologic:  Normal speech and language. No gross focal neurologic deficits are appreciated. Skin:  Skin is warm, dry and intact. No rash noted. Psychiatric: Mood and affect are normal. Speech and behavior are normal. Patient exhibits appropriate insight and judgment   ___________________________________________   LABS (all labs ordered are listed, but only abnormal results are displayed)   RADIOLOGY  Dg Tibia/fibula Right  Result Date: 02/21/2019 CLINICAL DATA:  RIGHT leg trauma. EXAM: RIGHT TIBIA AND FIBULA - 2 VIEW COMPARISON:  None. FINDINGS: There is no evidence of fracture or other focal bone lesions. Soft tissues are unremarkable. IMPRESSION: Negative. Electronically Signed   By: Franki Cabot M.D.   On: 02/21/2019 16:54   Dg Knee Complete 4 Views Right  Result Date: 02/21/2019 CLINICAL DATA:  RIGHT leg trauma. EXAM: RIGHT KNEE - COMPLETE 4+ VIEW COMPARISON:  None. FINDINGS: No evidence of  fracture, dislocation, or joint effusion. No evidence of arthropathy or other focal bone abnormality. Soft tissues are unremarkable. IMPRESSION: Negative. Electronically Signed   By: Franki Cabot M.D.   On: 02/21/2019 16:53   Dg Foot Complete Right  Result Date: 02/21/2019 CLINICAL DATA:  RIGHT lower leg trauma, pain to top of foot. EXAM: RIGHT FOOT COMPLETE - 3+ VIEW COMPARISON:  None. FINDINGS: There is no evidence of fracture or dislocation. There is no evidence of arthropathy or other focal bone abnormality. Soft tissues are unremarkable. IMPRESSION: Negative. Electronically Signed   By: Franki Cabot M.D.   On: 02/21/2019 16:54   ____________________________________________   PROCEDURES Procedures    INITIAL IMPRESSION / ASSESSMENT AND PLAN / ED COURSE  Pertinent labs & imaging results that were available during my care of the patient were reviewed by me and considered in  my medical decision making (see chart for details).  Overall well-appearing patient.  No acute distress.  Right leg pain after mechanical injury.  X-rays as above per radiologist, negative.  Suspect sprain injuries.  Encourage supportive care, rest, ice, elevation and Tylenol.  Gradually increase activity as tolerated.  Discussed follow-up with orthopedic this coming week for any continued pain.  Discussed follow up with Primary care physician this week. Discussed follow up and return parameters including no resolution or any worsening concerns. Patient verbalized understanding and agreed to plan.   ____________________________________________   FINAL CLINICAL IMPRESSION(S) / ED DIAGNOSES  Final diagnoses:  Sprain of right knee, unspecified ligament, initial encounter  Acute right ankle pain     ED Discharge Orders    None       Note: This dictation was prepared with Dragon dictation along with smaller phrase technology. Any transcriptional errors that result from this process are unintentional.          Marylene Land, NP 02/21/19 1703

## 2019-02-21 NOTE — ED Triage Notes (Addendum)
Pt was picking something up off the ground in the car on a kneeling position and twisted her right knee and ankle and fell to the floor. Pt states her blood pressure medicine might be too much as she has had a few falls over the past few months. Also drinks liquor daily.

## 2019-03-11 DIAGNOSIS — I50812 Chronic right heart failure: Secondary | ICD-10-CM | POA: Insufficient documentation

## 2019-06-29 ENCOUNTER — Encounter: Payer: Self-pay | Admitting: Emergency Medicine

## 2019-06-29 ENCOUNTER — Other Ambulatory Visit: Payer: Self-pay

## 2019-06-29 ENCOUNTER — Ambulatory Visit (INDEPENDENT_AMBULATORY_CARE_PROVIDER_SITE_OTHER): Payer: Self-pay

## 2019-06-29 ENCOUNTER — Telehealth: Payer: Self-pay | Admitting: Emergency Medicine

## 2019-06-29 ENCOUNTER — Ambulatory Visit
Admission: EM | Admit: 2019-06-29 | Discharge: 2019-06-29 | Disposition: A | Discharge status: Home / Self Care | Payer: Self-pay | Attending: Emergency Medicine | Admitting: Emergency Medicine

## 2019-06-29 DIAGNOSIS — L03115 Cellulitis of right lower limb: Secondary | ICD-10-CM

## 2019-06-29 DIAGNOSIS — M79671 Pain in right foot: Secondary | ICD-10-CM

## 2019-06-29 DIAGNOSIS — M25571 Pain in right ankle and joints of right foot: Secondary | ICD-10-CM

## 2019-06-29 DIAGNOSIS — M109 Gout, unspecified: Secondary | ICD-10-CM

## 2019-06-29 LAB — CBC WITH DIFFERENTIAL/PLATELET
Abs Immature Granulocytes: 0.04 10*3/uL (ref 0.00–0.07)
Basophils Absolute: 0 10*3/uL (ref 0.0–0.1)
Basophils Relative: 0 %
Eosinophils Absolute: 0 10*3/uL (ref 0.0–0.5)
Eosinophils Relative: 0 %
HCT: 37.3 % (ref 36.0–46.0)
Hemoglobin: 13 g/dL (ref 12.0–15.0)
Immature Granulocytes: 0 %
Lymphocytes Relative: 11 %
Lymphs Abs: 1.2 10*3/uL (ref 0.7–4.0)
MCH: 38.8 pg — ABNORMAL HIGH (ref 26.0–34.0)
MCHC: 34.9 g/dL (ref 30.0–36.0)
MCV: 111.3 fL — ABNORMAL HIGH (ref 80.0–100.0)
Monocytes Absolute: 0.7 10*3/uL (ref 0.1–1.0)
Monocytes Relative: 7 %
Neutro Abs: 8.8 10*3/uL — ABNORMAL HIGH (ref 1.7–7.7)
Neutrophils Relative %: 82 %
Platelets: 262 10*3/uL (ref 150–400)
RBC: 3.35 MIL/uL — ABNORMAL LOW (ref 3.87–5.11)
RDW: 12.2 % (ref 11.5–15.5)
WBC: 10.8 10*3/uL — ABNORMAL HIGH (ref 4.0–10.5)
nRBC: 0 % (ref 0.0–0.2)

## 2019-06-29 LAB — URIC ACID: Uric Acid, Serum: 12 mg/dL — ABNORMAL HIGH (ref 2.5–7.1)

## 2019-06-29 MED ORDER — CEPHALEXIN 500 MG PO CAPS: 500.0000 mg | ORAL_CAPSULE | Freq: Four times a day (QID) | ORAL | 0 refills | Status: DC

## 2019-06-29 MED ORDER — PREDNISONE 10 MG (21) PO TBPK: ORAL_TABLET | ORAL | 1 refills | Status: DC

## 2019-06-29 NOTE — Telephone Encounter (Signed)
Contacted patient per Jenita Seashore, NP and notified her that Uric Acid was 12.0. Patient was notified of results and to follow up with her PCP. Patient verbalized understanding and had no further questions or concerns.

## 2019-06-29 NOTE — ED Triage Notes (Signed)
Patient c/o right foot pain and swelling that started 2 days ago. Denies injury.

## 2019-06-29 NOTE — Discharge Instructions (Signed)
DO NOT TAKE IBUPROFEN OR BC POWDERS FOR PAIN.   TYLENOL ONLY.

## 2019-06-29 NOTE — ED Provider Notes (Signed)
[facility]    CSN: [id number] Arrival date & time: 06/29/19  1358      History   Chief Complaint Chief Complaint  Patient presents with  . Foot Pain    HPI Kellie Morris is a 63 y.o. female presenting with pain to right foot and ankle for 3 days. Per pt and pt's daughter, pt has chronic pain to this extremity and has occasional "flares" of increased pain. This episode however is so painful that she is unable to walk or press a gas pedal. No previous injuries to this foot/ankle and no previous known inflammatory disorder. Pt has been treating pain with ibuprofen 4 times a day. Pain is currently a constant 8/10 with no alleviating factors.   Past Medical History:  Diagnosis Date  . Chronic bronchitis ([place])   . History of DVT (deep vein thrombosis) 1976  . Hypertension     Patient Active Problem List   Diagnosis Date Noted  . Visit for preventive health examination 05/07/2013  . Chest pain 06/13/2012  . Palpitations 06/13/2012  . Smoking 06/13/2012    Past Surgical History:  Procedure Laterality Date  . TUBAL LIGATION      OB History   No obstetric history on file.      Home Medications    Prior to Admission medications   Medication Sig Start Date End Date Taking? Authorizing Provider  Aspirin-Salicylamide-Caffeine (BC HEADACHE POWDER PO) Take by mouth daily.   Yes [provider]  furosemide (LASIX) 20 MG tablet Take by mouth. 08/06/17 06/29/19 Yes [provider]  gabapentin (NEURONTIN) 100 MG capsule Take 100 mg by mouth 3 (three) times daily.   Yes [provider]  ipratropium (ATROVENT HFA) 17 MCG/ACT inhaler Inhale into the lungs.   Yes [provider]  lisinopril (PRINIVIL,ZESTRIL) 10 MG tablet Take 10 mg by mouth daily.   Yes [provider]  lovastatin (MEVACOR) 20 MG tablet Take 1 tablet (20 mg total) by mouth at bedtime. 05/07/14  Yes Minna Merritts, MD  metoprolol tartrate (LOPRESSOR) 25 MG tablet  Take 1 tablet (25 mg total) by mouth 2 (two) times daily. 05/07/14  Yes Minna Merritts, MD  Multiple Vitamin (MULTIVITAMIN) tablet Take 1 tablet by mouth daily.   Yes [provider]  rivaroxaban (XARELTO) 20 MG TABS tablet Take by mouth. 05/15/18 06/29/19 Yes [provider]  cephALEXin (KEFLEX) 500 MG capsule Take 1 capsule (500 mg total) by mouth 4 (four) times daily. 06/29/19   Gertie Baron, NP  nitroGLYCERIN (NITROSTAT) 0.4 MG SL tablet Place 1 tablet (0.4 mg total) under the tongue every 5 (five) minutes as needed for chest pain. 06/04/13 12/14/17  Minna Merritts, MD  predniSONE (STERAPRED UNI-PAK 21 TAB) 10 MG (21) TBPK tablet Take as instructed on package (60, 50, 40, 30, 20, 10) 06/29/19   Gertie Baron, NP    Family History Family History  Problem Relation Age of Onset  . Heart attack Father   . Heart attack Brother     Social History Social History   Tobacco Use  . Smoking status: Former Smoker    Packs/day: 1.00    Years: 40.00    Pack years: 40.00    Types: Cigarettes  . Smokeless tobacco: Never Used  Substance Use Topics  . Alcohol use: Yes    Comment:  daily 1/2 pint liquor  . Drug use: No     Allergies   Codeine   Review of Systems Review of  Systems  Musculoskeletal: Positive for arthralgias, gait problem, joint swelling and myalgias.  Skin: Positive for color change.  All other systems reviewed and are negative.    Physical Exam Triage Vital Signs ED Triage Vitals [06/29/19 1415]  Enc Vitals Group     BP 99/60     Pulse Rate (!) 102     Resp 18     Temp 98.4 F (36.9 C)     Temp Source Oral     SpO2 95 %     Weight 185 lb (83.9 kg)     Height 5\' 4"  (1.626 m)     Head Circumference      Peak Flow      Pain Score 10     Pain Loc      Pain Edu?      Excl. in [place]?    No data found.  Updated Vital Signs BP 99/60 (BP Location: Right Arm)   Pulse (!) 102   Temp 98.4 F (36.9 C) (Oral)   Resp 18   Ht 5\' 4"  (1.626 m)    Wt 185 lb (83.9 kg)   SpO2 95%   BMI 31.76 kg/m   Visual Acuity Right Eye Distance:   Left Eye Distance:   Bilateral Distance:    Right Eye Near:   Left Eye Near:    Bilateral Near:     Physical Exam Vitals signs and nursing note reviewed.  Constitutional:      General: She is not in acute distress.    Appearance: She is well-developed.  HENT:     Head: Normocephalic and atraumatic.  Eyes:     Conjunctiva/sclera: Conjunctivae normal.  Neck:     Musculoskeletal: Neck supple.  Cardiovascular:     Rate and Rhythm: Normal rate and regular rhythm.     Pulses:          Dorsalis pedis pulses are 2+ on the right side and 2+ on the left side.       Posterior tibial pulses are 2+ on the right side and 2+ on the left side.     Heart sounds: No murmur.  Pulmonary:     Effort: Pulmonary effort is normal. No respiratory distress.     Breath sounds: Normal breath sounds.  Abdominal:     Palpations: Abdomen is soft.     Tenderness: There is no abdominal tenderness.  Feet:     Right foot:     Skin integrity: Erythema, warmth and dry skin present.     Comments: Tenderness to 1st MTP and along 5th metatarsal. Pain is worse with pedal flexion and internal rotation.  Skin:    General: Skin is warm and dry.  Neurological:     Mental Status: She is alert.      UC Treatments / Results  Labs (all labs ordered are listed, but only abnormal results are displayed) Labs Reviewed  CBC WITH DIFFERENTIAL/PLATELET - Abnormal; Notable for the following components:      Result Value   WBC 10.8 (*)    RBC 3.35 (*)    MCV 111.3 (*)    MCH 38.8 (*)    Neutro Abs 8.8 (*)    All other components within normal limits  URIC ACID - Abnormal; Notable for the following components:   Uric Acid, Serum 12.0 (*)    All other components within normal limits    EKG   Radiology Dg Ankle Complete Right  Result Date: 06/29/2019 CLINICAL DATA:  Lateral ankle pain 3 days. EXAM: RIGHT ANKLE -  COMPLETE 3+ VIEW COMPARISON:  02/21/2019 FINDINGS: There is no evidence of fracture, dislocation, or joint effusion. There is no evidence of arthropathy or other focal bone abnormality. Soft tissues are unremarkable. IMPRESSION: Negative. Electronically Signed   By: Franchot Gallo M.D.   On: 06/29/2019 15:45   Dg Foot Complete Right  Result Date: 06/29/2019 CLINICAL DATA:  Pain and swelling.  Redness. EXAM: RIGHT FOOT COMPLETE - 3+ VIEW COMPARISON:  02/21/2019. FINDINGS: No acute bony or joint abnormality. No evidence of fracture or dislocation. No radiopaque foreign body. IMPRESSION: No acute abnormality. Electronically Signed   By: Marcello Moores  Register   On: 06/29/2019 15:46    Procedures Procedures (including critical care time)  Medications Ordered in UC Medications - No data to display  Initial Impression / Assessment and Plan / UC Course  I have reviewed the triage vital signs and the nursing notes.  Pertinent labs & imaging results that were available during my care of the patient were reviewed by me and considered in my medical decision making (see chart for details).     Pt presents with right foot pain, diagnosed with gout and cellulitis and treated as such with the prescribed medications below. Pt to follow-up with PCP ASAP for further management. Pt and daughter agreed. All questions answered and all concerns addressed.   Final Clinical Impressions(s) / UC Diagnoses   Final diagnoses:  Right foot pain  Cellulitis of right foot     Discharge Instructions     DO NOT TAKE IBUPROFEN OR BC POWDERS FOR PAIN.   TYLENOL ONLY.    ED Prescriptions    Medication Sig Dispense Auth. Provider   predniSONE (STERAPRED UNI-PAK 21 TAB) 10 MG (21) TBPK tablet Take as instructed on package (60, 50, 40, 30, 20, 10) 1 each Gertie Baron, NP   cephALEXin (KEFLEX) 500 MG capsule Take 1 capsule (500 mg total) by mouth 4 (four) times daily. 28 capsule Gertie Baron, NP     Controlled  Substance Prescriptions [place] Controlled Substance Registry consulted? Not Applicable    Gertie Baron, DNP, NP-c    Gertie Baron, NP 06/29/19 1650

## 2019-09-05 ENCOUNTER — Ambulatory Visit
Admission: EM | Admit: 2019-09-05 | Discharge: 2019-09-05 | Disposition: A | Discharge status: Home / Self Care | Payer: Self-pay | Attending: Internal Medicine | Admitting: Internal Medicine

## 2019-09-05 ENCOUNTER — Other Ambulatory Visit: Payer: Self-pay

## 2019-09-05 ENCOUNTER — Ambulatory Visit (INDEPENDENT_AMBULATORY_CARE_PROVIDER_SITE_OTHER): Payer: Self-pay

## 2019-09-05 ENCOUNTER — Encounter: Payer: Self-pay | Admitting: Emergency Medicine

## 2019-09-05 DIAGNOSIS — S39012A Strain of muscle, fascia and tendon of lower back, initial encounter: Secondary | ICD-10-CM

## 2019-09-05 DIAGNOSIS — S22000A Wedge compression fracture of unspecified thoracic vertebra, initial encounter for closed fracture: Secondary | ICD-10-CM

## 2019-09-05 DIAGNOSIS — R52 Pain, unspecified: Secondary | ICD-10-CM

## 2019-09-05 DIAGNOSIS — W19XXXA Unspecified fall, initial encounter: Secondary | ICD-10-CM

## 2019-09-05 DIAGNOSIS — W01190A Fall on same level from slipping, tripping and stumbling with subsequent striking against furniture, initial encounter: Secondary | ICD-10-CM

## 2019-09-05 MED ORDER — TRAMADOL HCL 50 MG PO TABS: 50.0000 mg | ORAL_TABLET | Freq: Four times a day (QID) | ORAL | 0 refills | Status: DC | PRN

## 2019-09-05 NOTE — Discharge Instructions (Addendum)
FOLLOW UP WITH YOUR PRIMARY CARE PROVIDER IN 7-10 DAYS OR SCHEDULED APPOINTMENT IF THEY CANT WORK YOU IN OR PREFERS TO SEE YOU ON DATE IT IS ALREADY SCHEDULED.

## 2019-09-05 NOTE — ED Provider Notes (Signed)
[facility]    CSN: [id number] Arrival date & time: 09/05/19  1157      History   Chief Complaint Chief Complaint  Patient presents with  . Rib Pain  . Fall  . Facial Injury    HPI Kellie Morris is a 63 y.o. female. who presents with injuries from a fall from 6 days ago. Her cat jumped in front of her and made her trip and she fell on her R side, hitting her lower R jaw on the edge of the couch. She thought she would just be sore and wound get better, so did not seek care, but her pain is getting worse. She states she heard 3 loud snaps on her rib area when she fell. Has hx of fractured ribs on both sides in 1988. Her pain is also located on her lower back and sides of her mid ribs. She denies LOC. Her neck only hurts on the R side for 3 days, but has resolved. Denies R arm or leg pain.  In the past was told she has signs of osteoporosis     Past Medical History:  Diagnosis Date  . Chronic bronchitis ([place])   . History of DVT (deep vein thrombosis) 1976  . Hypertension     Patient Active Problem List   Diagnosis Date Noted  . Visit for preventive health examination 05/07/2013  . Chest pain 06/13/2012  . Palpitations 06/13/2012  . Smoking 06/13/2012    Past Surgical History:  Procedure Laterality Date  . TUBAL LIGATION      OB History   No obstetric history on file.      Home Medications    Prior to Admission medications   Medication Sig Start Date End Date Taking? Authorizing Provider  furosemide (LASIX) 20 MG tablet Take by mouth. 08/06/17 09/05/19 Yes [provider]  gabapentin (NEURONTIN) 100 MG capsule Take 100 mg by mouth 3 (three) times daily.   Yes [provider]  lisinopril (PRINIVIL,ZESTRIL) 10 MG tablet Take 10 mg by mouth daily.   Yes [provider]  lovastatin (MEVACOR) 20 MG tablet Take 1 tablet (20 mg total) by mouth at bedtime. 05/07/14  Yes Minna Merritts, MD  metoprolol tartrate (LOPRESSOR) 25 MG tablet  Take 1 tablet (25 mg total) by mouth 2 (two) times daily. 05/07/14  Yes Minna Merritts, MD  Multiple Vitamin (MULTIVITAMIN) tablet Take 1 tablet by mouth daily.   Yes [provider]  rivaroxaban (XARELTO) 20 MG TABS tablet Take by mouth. 05/15/18 09/05/19 Yes [provider]  Aspirin-Salicylamide-Caffeine (BC HEADACHE POWDER PO) Take by mouth daily.    [provider]  ipratropium (ATROVENT HFA) 17 MCG/ACT inhaler Inhale into the lungs.    [provider]  nitroGLYCERIN (NITROSTAT) 0.4 MG SL tablet Place 1 tablet (0.4 mg total) under the tongue every 5 (five) minutes as needed for chest pain. 06/04/13 12/14/17  Minna Merritts, MD  traMADol (ULTRAM) 50 MG tablet Take 1 tablet (50 mg total) by mouth every 6 (six) hours as needed. 09/05/19   Rodriguez-Southworth, Sunday Spillers, PA-C    Family History Family History  Problem Relation Age of Onset  . Heart attack Father   . Heart attack Brother     Social History Social History   Tobacco Use  . Smoking status: Former Smoker    Packs/day: 1.00    Years: 40.00    Pack years: 40.00    Types: Cigarettes  . Smokeless tobacco: Never Used  Substance Use Topics  . Alcohol use: Yes    Comment:  daily 1/2 pint liquor  . Drug use: No     Allergies   Codeine   Review of Systems Review of Systems  Constitutional: Positive for fatigue. Negative for chills, diaphoresis and fever.  Respiratory: Negative for cough, chest tightness and shortness of breath.   Cardiovascular: Positive for chest pain. Negative for leg swelling.  Gastrointestinal: Negative for abdominal distention, abdominal pain and nausea.  Genitourinary: Negative for difficulty urinating and flank pain.  Musculoskeletal: Positive for back pain. Negative for gait problem, neck pain and neck stiffness.       + rib pain bilaterally  Skin: Negative for rash.  Neurological: Negative for dizziness, numbness and headaches.     Physical Exam Triage  Vital Signs ED Triage Vitals  Enc Vitals Group     BP 09/05/19 1237 122/69     Pulse Rate 09/05/19 1237 93     Resp 09/05/19 1237 16     Temp 09/05/19 1237 97.6 F (36.4 C)     Temp Source 09/05/19 1237 Oral     SpO2 09/05/19 1237 95 %     Weight 09/05/19 1234 190 lb (86.2 kg)     Height 09/05/19 1234 5\' 4"  (1.626 m)     Head Circumference --      Peak Flow --      Pain Score 09/05/19 1233 9     Pain Loc --      Pain Edu? --      Excl. in [place]? --    No data found.  Updated Vital Signs BP 122/69 (BP Location: Left Arm)   Pulse 93   Temp 97.6 F (36.4 C) (Oral)   Resp 16   Ht 5\' 4"  (1.626 m)   Wt 190 lb (86.2 kg)   SpO2 95%   BMI 32.61 kg/m   Visual Acuity Right Eye Distance:   Left Eye Distance:   Bilateral Distance:    Right Eye Near:   Left Eye Near:    Bilateral Near:     Physical Exam Vitals signs and nursing note reviewed.  Constitutional:      Appearance: Normal appearance. She is obese.  HENT:     Right Ear: External ear normal.     Left Ear: External ear normal.     Nose: Nose normal.  Eyes:     General: No scleral icterus.    Extraocular Movements: Extraocular movements intact.     Conjunctiva/sclera: Conjunctivae normal.     Pupils: Pupils are equal, round, and reactive to light.  Neck:     Musculoskeletal: Neck supple. No neck rigidity or muscular tenderness.  Cardiovascular:     Rate and Rhythm: Normal rate and regular rhythm.  Pulmonary:     Breath sounds: Normal breath sounds.  Abdominal:     Palpations: Abdomen is soft. There is no mass.     Tenderness: There is no abdominal tenderness.  Musculoskeletal: Normal range of motion.     Comments: BACK- has point tenderness on T5 and T10, and throughout her lumbar spine. ROM of spine to lateral flexion provoked pain on her lateral ribs.   Lymphadenopathy:     Cervical: No cervical adenopathy.  Skin:    General: Skin is warm and dry.     Capillary Refill: Capillary refill takes less than 2  seconds.     Findings: No bruising, lesion or rash.  Neurological:     Mental  Status: She is alert and oriented to person, place, and time.     Motor: No weakness.     Coordination: Coordination normal.     Gait: Gait normal.     Deep Tendon Reflexes: Reflexes normal.  Psychiatric:        Mood and Affect: Mood normal.        Behavior: Behavior normal.        Thought Content: Thought content normal.     UC Treatments / Results  Labs (all labs ordered are listed, but only abnormal results are displayed) Labs Reviewed - No data to display  EKG   Radiology Dg Ribs Unilateral Right  Result Date: 09/05/2019 CLINICAL DATA:  Thoracic spine and bilateral rib pain following a fall at home 8 days ago. EXAM: RIGHT RIBS - 2 VIEW COMPARISON:  Chest and left rib radiographs obtained today and chest radiographs dated 12/14/2017. FINDINGS: Multiple old, healed right rib fractures. No acute fracture or pneumothorax seen. Diffuse osteopenia. IMPRESSION: No acute abnormality. Old, healed right rib fractures. Electronically Signed   By: Claudie Revering M.D.   On: 09/05/2019 14:06   Dg Ribs Unilateral W/chest Left  Result Date: 09/05/2019 CLINICAL DATA:  Severe bilateral mid rib pain, greater on the left, following a fall. The patient also has thoracic spine pain at approximately the T5 level. Ex-smoker. EXAM: LEFT RIBS AND CHEST - 3+ VIEW COMPARISON:  Chest radiographs dated 12/14/2017 FINDINGS: Mildly progressive enlargement of the cardiac silhouette with stable mild prominence of the pulmonary vasculature. Slightly decreased depth of inspiration. Interval small amount of linear atelectasis or scarring in the left mid lung zone. Old, healed left 6th rib fracture. No acute fracture and no pneumothorax. IMPRESSION: 1. No acute abnormality. 2. Mildly progressive cardiomegaly with stable mild pulmonary vascular congestion. 3. Interval small amount of linear atelectasis or scarring in the left mid lung zone.  Electronically Signed   By: Claudie Revering M.D.   On: 09/05/2019 14:00   Dg Thoracic Spine 2 View  Result Date: 09/05/2019 CLINICAL DATA:  Midthoracic back pain following a fall at home 8 days ago. EXAM: THORACIC SPINE 2 VIEWS COMPARISON:  Chest radiographs dated 12/14/2017. Chest and bilateral rib radiographs obtained today. FINDINGS: Approximately 20% T10 superior endplate compression deformity with mild sclerosis and no visible fracture lines. No bony retropulsion seen. Stable mild scoliosis. No subluxations. IMPRESSION: Approximately 20% T10 superior endplate compression fracture with mild sclerosis and no visible fracture lines, new since 12/14/2017. Electronically Signed   By: Claudie Revering M.D.   On: 09/05/2019 14:05   Dg Lumbar Spine Complete  Result Date: 09/05/2019 CLINICAL DATA:  Fall, back/rib pain EXAM: LUMBAR SPINE - COMPLETE 4+ VIEW COMPARISON:  None. FINDINGS: Five lumbar-type vertebral bodies. Normal lumbar lordosis. No evidence of fracture or dislocation. Vertebral body heights and intervertebral disc spaces are maintained. Mild degenerative changes of the lower lumbar spine. Visualized bony pelvis appears intact. Vascular calcifications. IMPRESSION: Negative. Electronically Signed   By: Julian Hy M.D.   On: 09/05/2019 13:57    Procedures Procedures (including critical care time)  Medications Ordered in UC Medications - No data to display  Initial Impression / Assessment and Plan / UC Course  I have reviewed the triage vital signs and the nursing notes.  Pertinent  imaging results that were available during my care of the patient were reviewed by me and considered in my medical decision making (see chart for details). She does not have health insurance and is concerned about  the cost to see ortho for FU, so will just FU with her PCP I have her Tramadol  for pain which she has taken before and tolerated this fine.     Final Clinical Impressions(s) / UC Diagnoses    Final diagnoses:  Fall, initial encounter  Thoracic compression fracture, closed, initial encounter ([place])  Lumbar strain, initial encounter     Discharge Instructions     FOLLOW UP WITH YOUR PRIMARY CARE PROVIDER IN 7-10 DAYS OR SCHEDULED APPOINTMENT IF THEY CANT WORK YOU IN OR PREFERS TO SEE YOU ON DATE IT IS ALREADY SCHEDULED.     ED Prescriptions    Medication Sig Dispense Auth. Provider   traMADol (ULTRAM) 50 MG tablet Take 1 tablet (50 mg total) by mouth every 6 (six) hours as needed. 15 tablet Rodriguez-Southworth, Sunday Spillers, PA-C     I have reviewed the PDMP during this encounter.   Shelby Mattocks, [place] 09/05/19 1741

## 2019-09-05 NOTE — ED Triage Notes (Signed)
Patient c/o back pain, bilateral rib pain, and right sided facial pain that started after she fell last Friday.  Patient denies LOC.

## 2019-10-07 DIAGNOSIS — D649 Anemia, unspecified: Secondary | ICD-10-CM | POA: Insufficient documentation

## 2019-10-07 DIAGNOSIS — R7989 Other specified abnormal findings of blood chemistry: Secondary | ICD-10-CM | POA: Insufficient documentation

## 2019-10-08 DIAGNOSIS — D689 Coagulation defect, unspecified: Secondary | ICD-10-CM | POA: Insufficient documentation

## 2019-10-08 DIAGNOSIS — E638 Other specified nutritional deficiencies: Secondary | ICD-10-CM | POA: Insufficient documentation

## 2019-10-08 DIAGNOSIS — E871 Hypo-osmolality and hyponatremia: Secondary | ICD-10-CM | POA: Insufficient documentation

## 2019-10-27 DIAGNOSIS — K2971 Gastritis, unspecified, with bleeding: Secondary | ICD-10-CM | POA: Insufficient documentation

## 2019-10-27 DIAGNOSIS — E669 Obesity, unspecified: Secondary | ICD-10-CM | POA: Insufficient documentation

## 2019-11-08 DIAGNOSIS — J90 Pleural effusion, not elsewhere classified: Secondary | ICD-10-CM | POA: Insufficient documentation

## 2019-11-19 MED ORDER — ALBUMIN HUMAN 25 % IV SOLN
25.00 | INTRAVENOUS | Status: DC
Start: ? — End: 2019-11-19

## 2019-11-19 MED ORDER — METOPROLOL TARTRATE 25 MG PO TABS
12.50 | ORAL_TABLET | ORAL | Status: DC
Start: 2019-11-19 — End: 2019-11-19

## 2019-11-19 MED ORDER — GENERIC EXTERNAL MEDICATION
1.80 | Status: DC
Start: ? — End: 2019-11-19

## 2019-11-19 MED ORDER — BUDESONIDE 0.25 MG/2ML IN SUSP
0.25 | RESPIRATORY_TRACT | Status: DC
Start: 2019-11-19 — End: 2019-11-19

## 2019-11-19 MED ORDER — SODIUM CHLORIDE 3 % IN NEBU
4.00 | INHALATION_SOLUTION | RESPIRATORY_TRACT | Status: DC
Start: 2019-11-20 — End: 2019-11-19

## 2019-11-19 MED ORDER — GENERIC EXTERNAL MEDICATION
Status: DC
Start: ? — End: 2019-11-19

## 2019-11-19 MED ORDER — POLYETHYLENE GLYCOL 3350 17 GM/SCOOP PO POWD
17.00 | ORAL | Status: DC
Start: 2019-11-19 — End: 2019-11-19

## 2019-11-19 MED ORDER — GABAPENTIN 300 MG PO CAPS
300.00 | ORAL_CAPSULE | ORAL | Status: DC
Start: 2019-11-19 — End: 2019-11-19

## 2019-11-19 MED ORDER — GENERIC EXTERNAL MEDICATION
1.90 | Status: DC
Start: ? — End: 2019-11-19

## 2019-11-19 MED ORDER — THIAMINE HCL 100 MG PO TABS
100.00 | ORAL_TABLET | ORAL | Status: DC
Start: 2019-11-20 — End: 2019-11-19

## 2019-11-19 MED ORDER — ACETAMINOPHEN 325 MG PO TABS
650.00 | ORAL_TABLET | ORAL | Status: DC
Start: ? — End: 2019-11-19

## 2019-11-19 MED ORDER — PANTOPRAZOLE SODIUM 40 MG PO TBEC
40.00 | DELAYED_RELEASE_TABLET | ORAL | Status: DC
Start: 2019-11-20 — End: 2019-11-19

## 2019-11-19 MED ORDER — MELATONIN 3 MG PO TABS
3.00 | ORAL_TABLET | ORAL | Status: DC
Start: 2019-11-19 — End: 2019-11-19

## 2019-11-19 MED ORDER — GENERIC EXTERNAL MEDICATION
15.00 | Status: DC
Start: 2019-11-19 — End: 2019-11-19

## 2019-11-19 MED ORDER — IPRATROPIUM BROMIDE 0.02 % IN SOLN
500.00 | RESPIRATORY_TRACT | Status: DC
Start: 2019-11-19 — End: 2019-11-19

## 2019-11-19 MED ORDER — PNV PRENATAL PLUS MULTIVITAMIN 27-1 MG PO TABS
1.00 | ORAL_TABLET | ORAL | Status: DC
Start: 2019-11-20 — End: 2019-11-19

## 2019-11-19 MED ORDER — APIXABAN 2.5 MG PO TABS
2.50 | ORAL_TABLET | ORAL | Status: DC
Start: 2019-11-19 — End: 2019-11-19

## 2019-11-19 MED ORDER — GENERIC EXTERNAL MEDICATION
125.00 | Status: DC
Start: ? — End: 2019-11-19

## 2019-11-19 MED ORDER — ERGOCALCIFEROL 1.25 MG (50000 UT) PO CAPS
50000.00 | ORAL_CAPSULE | ORAL | Status: DC
Start: 2019-11-26 — End: 2019-11-19

## 2019-11-19 MED ORDER — ALBUTEROL SULFATE (2.5 MG/3ML) 0.083% IN NEBU
2.50 | INHALATION_SOLUTION | RESPIRATORY_TRACT | Status: DC
Start: ? — End: 2019-11-19

## 2019-11-19 MED ORDER — ARFORMOTEROL TARTRATE 15 MCG/2ML IN NEBU
15.00 | INHALATION_SOLUTION | RESPIRATORY_TRACT | Status: DC
Start: 2019-11-19 — End: 2019-11-19

## 2019-11-19 MED ORDER — LIDOCAINE 5 % EX PTCH
2.00 | MEDICATED_PATCH | CUTANEOUS | Status: DC
Start: 2019-11-20 — End: 2019-11-19

## 2019-11-25 DIAGNOSIS — Z48813 Encounter for surgical aftercare following surgery on the respiratory system: Secondary | ICD-10-CM | POA: Insufficient documentation

## 2019-11-25 DIAGNOSIS — R52 Pain, unspecified: Secondary | ICD-10-CM | POA: Insufficient documentation

## 2019-12-03 DIAGNOSIS — Z9981 Dependence on supplemental oxygen: Secondary | ICD-10-CM | POA: Insufficient documentation

## 2019-12-10 DIAGNOSIS — Z9989 Dependence on other enabling machines and devices: Secondary | ICD-10-CM | POA: Insufficient documentation

## 2019-12-10 DIAGNOSIS — Z992 Dependence on renal dialysis: Secondary | ICD-10-CM | POA: Insufficient documentation

## 2019-12-11 DIAGNOSIS — N179 Acute kidney failure, unspecified: Secondary | ICD-10-CM | POA: Insufficient documentation

## 2019-12-11 DIAGNOSIS — T782XXA Anaphylactic shock, unspecified, initial encounter: Secondary | ICD-10-CM | POA: Insufficient documentation

## 2019-12-20 DIAGNOSIS — D509 Iron deficiency anemia, unspecified: Secondary | ICD-10-CM | POA: Insufficient documentation

## 2019-12-23 DIAGNOSIS — E8779 Other fluid overload: Secondary | ICD-10-CM | POA: Insufficient documentation

## 2020-01-07 DIAGNOSIS — T7840XA Allergy, unspecified, initial encounter: Secondary | ICD-10-CM | POA: Insufficient documentation

## 2020-01-13 DIAGNOSIS — M545 Low back pain, unspecified: Secondary | ICD-10-CM | POA: Insufficient documentation

## 2020-01-14 DIAGNOSIS — E46 Unspecified protein-calorie malnutrition: Secondary | ICD-10-CM | POA: Insufficient documentation

## 2020-01-14 DIAGNOSIS — R770 Abnormality of albumin: Secondary | ICD-10-CM | POA: Insufficient documentation

## 2020-04-19 ENCOUNTER — Other Ambulatory Visit: Payer: Self-pay | Admitting: Student in an Organized Health Care Education/Training Program

## 2020-04-19 ENCOUNTER — Encounter: Payer: Self-pay | Admitting: Student in an Organized Health Care Education/Training Program

## 2020-04-19 ENCOUNTER — Ambulatory Visit
Payer: Medicare Other | Attending: Student in an Organized Health Care Education/Training Program | Admitting: Student in an Organized Health Care Education/Training Program

## 2020-04-19 ENCOUNTER — Other Ambulatory Visit: Payer: Self-pay

## 2020-04-19 VITALS — BP 141/81 | HR 92 | Temp 97.0°F | Resp 16 | Ht 62.0 in | Wt 153.0 lb

## 2020-04-19 DIAGNOSIS — G8929 Other chronic pain: Secondary | ICD-10-CM | POA: Insufficient documentation

## 2020-04-19 DIAGNOSIS — M546 Pain in thoracic spine: Secondary | ICD-10-CM | POA: Insufficient documentation

## 2020-04-19 DIAGNOSIS — M5136 Other intervertebral disc degeneration, lumbar region: Secondary | ICD-10-CM | POA: Insufficient documentation

## 2020-04-19 DIAGNOSIS — M47816 Spondylosis without myelopathy or radiculopathy, lumbar region: Secondary | ICD-10-CM

## 2020-04-19 DIAGNOSIS — S22000S Wedge compression fracture of unspecified thoracic vertebra, sequela: Secondary | ICD-10-CM | POA: Insufficient documentation

## 2020-04-19 DIAGNOSIS — S22000A Wedge compression fracture of unspecified thoracic vertebra, initial encounter for closed fracture: Secondary | ICD-10-CM | POA: Insufficient documentation

## 2020-04-19 DIAGNOSIS — G894 Chronic pain syndrome: Secondary | ICD-10-CM | POA: Diagnosis present

## 2020-04-19 NOTE — Progress Notes (Signed)
Patient: Kellie Morris  Service Category: E/M  Provider: Gillis Santa, MD  DOB: 02-Sep-1956  DOS: 04/19/2020  Referring Provider: Gale Journey, MD  MRN: [id number]  Setting: Ambulatory outpatient  PCP: [facility]  Type: New Patient  Specialty: Interventional Pain Management    Location: Office  Delivery: Face-to-face     Primary Reason(s) for Visit: Encounter for initial evaluation of one or more chronic problems (new to examiner) potentially causing chronic pain, and posing a threat to normal musculoskeletal function. (Level of risk: High) CC: Back Pain (mid,lower)  HPI  Kellie Morris is a 64 y.o. year old, female patient, who comes today to see [place] for the first time for an initial evaluation of her chronic pain. She has Chest pain; Palpitations; Smoking; Visit for preventive health examination; Chronic pain syndrome; Lumbar spondylosis; Lumbar degenerative disc disease; Compression fracture of thoracic vertebra (HCC); and Chronic thoracic spine pain on their problem list. Today she comes in for evaluation of her Back Pain (mid,lower)  Pain Assessment: Location: Mid, Lower Back Radiating: left and right side Onset: More than a month ago Duration: Chronic pain Quality: Aching, Constant, Dull Severity: 7 /10 (subjective, self-reported pain score)  Note: Reported level is compatible with observation.                         When using our objective Pain Scale, levels between 6 and 10/10 are said to belong in an emergency room, as it progressively worsens from a 6/10, described as severely limiting, requiring emergency care not usually available at an outpatient pain management facility. At a 6/10 level, communication becomes difficult and requires great effort. Assistance to reach the emergency department may be required. Facial flushing and profuse sweating along with potentially dangerous increases in heart rate and blood pressure will be evident. Effect on ADL: can't sit right,  walking Timing: Constant Modifying factors: medications, heat BP: (!) 141/81  HR: 92  Onset and Duration: Gradual Cause of pain: Fall in 09/2019 Severity: No change since onset, NAS-11 at its worse: 9/10, NAS-11 at its best: 5/10, NAS-11 now: 7/10 and NAS-11 on the average: 5/10 Timing: After activity or exercise and After a period of immobility Aggravating Factors: Bending, Lifiting, Prolonged sitting, Prolonged standing, Stooping , Twisting and Walking Alleviating Factors: Hot packs, Medications and Warm showers or baths Associated Problems: Swelling, Weakness, Pain that wakes patient up and Pain that does not allow patient to sleep Quality of Pain: Aching, Stabbing and Throbbing Previous Examinations or Tests: CT scan and X-rays Previous Treatments: Narcotic medications  The patient comes into the clinics today for the first time for a chronic pain management evaluation.    Patient is a 64 year old female with a complex medical history which includes end-stage renal disease on hemodialysis twice a week, COPD on continuous oxygen, obstructive sleep apnea, atrial fibrillation on Xarelto, pulmonary hypertension with chronic mid and low back pain as result of a fall that she had in October 2020 which resulted in thoracic compression fractures.  She is currently on gabapentin 300 mg nightly.  She is not able to take NSAIDs.  She does occasionally take acetaminophen.  She has taken Flexeril in the past as well as tramadol which were not very effective.  She denies any weakness of her legs although she does feel unstable at times when she is standing up for an extended period.  She denies any bowel or bladder dysfunction.  She does have difficulty ambulating.  She  has a history of tobacco abuse.  She is currently not on any opioid analgesics.  Historic Controlled Substance Pharmacotherapy Review   Historical Monitoring: The patient  reports no history of drug use. List of all UDS Test(s): No  results found for: MDMA, COCAINSCRNUR, [place], [place], CANNABQUANT, THCU, [place] List of other Serum/Urine Drug Screening Test(s):  No results found for: AMPHSCRSER, BARBSCRSER, BENZOSCRSER, COCAINSCRSER, COCAINSCRNUR, PCPSCRSER, PCPQUANT, THCSCRSER, THCU, CANNABQUANT, OPIATESCRSER, OXYSCRSER, PROPOXSCRSER, ETH Historical Background Evaluation: [place] PMP: PDMP reviewed during this encounter. Six (6) year initial data search conducted.             [place] Department of public safety, offender search: Editor, commissioning Information) Non-contributory Risk Assessment Profile: Aberrant behavior: None observed or detected today Risk factors for fatal opioid overdose: None identified today Fatal overdose hazard ratio (HR): Calculation deferred Non-fatal overdose hazard ratio (HR): Calculation deferred Risk of opioid abuse or dependence: 0.7-3.0% with doses ? 36 MME/day and 6.1-26% with doses ? 120 MME/day. Substance use disorder (SUD) risk level: Pending results of Medical Psychology Evaluation for SUD  Pharmacologic Plan: As per protocol, I have not taken over any controlled substance management, pending the results of ordered tests and/or consults.            Initial impression: Pending review of available data and ordered tests.  Meds   Current Outpatient Medications:  .  furosemide (LASIX) 40 MG tablet, Take 40 mg by mouth 2 (two) times daily., Disp: , Rfl:  .  gabapentin (NEURONTIN) 100 MG capsule, Take 100 mg by mouth 3 (three) times daily., Disp: , Rfl:  .  ipratropium (ATROVENT HFA) 17 MCG/ACT inhaler, Inhale into the lungs., Disp: , Rfl:  .  lovastatin (MEVACOR) 20 MG tablet, Take 1 tablet (20 mg total) by mouth at bedtime., Disp: 90 tablet, Rfl: 3 .  metoprolol tartrate (LOPRESSOR) 25 MG tablet, Take 1 tablet (25 mg total) by mouth 2 (two) times daily., Disp: 180 tablet, Rfl: 3 .  Multiple Vitamin (MULTIVITAMIN) tablet, Take 1 tablet by mouth daily., Disp: , Rfl:  .  Aspirin-Salicylamide-Caffeine (BC  HEADACHE POWDER PO), Take by mouth daily., Disp: , Rfl:  .  furosemide (LASIX) 20 MG tablet, Take by mouth., Disp: , Rfl:  .  lisinopril (PRINIVIL,ZESTRIL) 10 MG tablet, Take 10 mg by mouth daily., Disp: , Rfl:  .  nitroGLYCERIN (NITROSTAT) 0.4 MG SL tablet, Place 1 tablet (0.4 mg total) under the tongue every 5 (five) minutes as needed for chest pain., Disp: 25 tablet, Rfl: 6 .  rivaroxaban (XARELTO) 20 MG TABS tablet, Take by mouth., Disp: , Rfl:  .  traMADol (ULTRAM) 50 MG tablet, Take 1 tablet (50 mg total) by mouth every 6 (six) hours as needed. (Patient not taking: Reported on 04/19/2020), Disp: 15 tablet, Rfl: 0  Imaging Review   Thoracic DG 2-3 views:  Results for orders placed during the hospital encounter of 09/05/19  DG Thoracic Spine 2 View   Narrative CLINICAL DATA:  Midthoracic back pain following a fall at home 8 days ago.  EXAM: THORACIC SPINE 2 VIEWS  COMPARISON:  Chest radiographs dated 12/14/2017. Chest and bilateral rib radiographs obtained today.  FINDINGS: Approximately 20% T10 superior endplate compression deformity with mild sclerosis and no visible fracture lines. No bony retropulsion seen. Stable mild scoliosis. No subluxations.  IMPRESSION: Approximately 20% T10 superior endplate compression fracture with mild sclerosis and no visible fracture lines, new since 12/14/2017.   Electronically Signed   By: Percell Locus.D.  On: 09/05/2019 14:05     Lumbar DG (Complete) 4+V:  Results for orders placed during the hospital encounter of 09/05/19  DG Lumbar Spine Complete   Narrative CLINICAL DATA:  Fall, back/rib pain  EXAM: LUMBAR SPINE - COMPLETE 4+ VIEW  COMPARISON:  None.  FINDINGS: Five lumbar-type vertebral bodies.  Normal lumbar lordosis.  No evidence of fracture or dislocation. Vertebral body heights and intervertebral disc spaces are maintained.  Mild degenerative changes of the lower lumbar spine.  Visualized bony pelvis appears  intact.  Vascular calcifications.  IMPRESSION: Negative.   Electronically Signed   By: Julian Hy M.D.   On: 09/05/2019 13:57           Results for orders placed during the hospital encounter of 02/21/19  DG Knee Complete 4 Views Right   Narrative CLINICAL DATA:  RIGHT leg trauma.  EXAM: RIGHT KNEE - COMPLETE 4+ VIEW  COMPARISON:  None.  FINDINGS: No evidence of fracture, dislocation, or joint effusion. No evidence of arthropathy or other focal bone abnormality. Soft tissues are unremarkable.  IMPRESSION: Negative.   Electronically Signed   By: Franki Cabot M.D.   On: 02/21/2019 16:53    Results for orders placed during the hospital encounter of 06/29/19  DG Ankle Complete Right   Narrative CLINICAL DATA:  Lateral ankle pain 3 days.  EXAM: RIGHT ANKLE - COMPLETE 3+ VIEW  COMPARISON:  02/21/2019  FINDINGS: There is no evidence of fracture, dislocation, or joint effusion. There is no evidence of arthropathy or other focal bone abnormality. Soft tissues are unremarkable.  IMPRESSION: Negative.   Electronically Signed   By: Franchot Gallo M.D.   On: 06/29/2019 15:45    Results for orders placed during the hospital encounter of 06/29/19  DG Foot Complete Right   Narrative CLINICAL DATA:  Pain and swelling.  Redness.  EXAM: RIGHT FOOT COMPLETE - 3+ VIEW  COMPARISON:  02/21/2019.  FINDINGS: No acute bony or joint abnormality. No evidence of fracture or dislocation. No radiopaque foreign body.  IMPRESSION: No acute abnormality.   Electronically Signed   By: Marcello Moores  Register   On: 06/29/2019 15:46      Complexity Note: Imaging results reviewed. Results shared with Ms. Deboy, using Layman's terms.                         ROS  Cardiovascular: Heart trouble, Abnormal heart rhythm, Chest pain, Weak heart (CHF), Heart catheterization and Blood thinners:  Anticoagulant Pulmonary or Respiratory: Lung problems, Difficulty blowing air out  (Emphysema), Shortness of breath and Coughing up mucus (Bronchitis) Neurological: No reported neurological signs or symptoms such as seizures, abnormal skin sensations, urinary and/or fecal incontinence, being born with an abnormal open spine and/or a tethered spinal cord Psychological-Psychiatric: Depressed Gastrointestinal: No reported gastrointestinal signs or symptoms such as vomiting or evacuating blood, reflux, heartburn, alternating episodes of diarrhea and constipation, inflamed or scarred liver, or pancreas or irrregular and/or infrequent bowel movements Genitourinary: Difficulty producing urine (Renal failure) Hematological: Brusing easily, Bleeding easily and Coagulation disorder Endocrine: No reported endocrine signs or symptoms such as high or low blood sugar, rapid heart rate due to high thyroid levels, obesity or weight gain due to slow thyroid or thyroid disease Rheumatologic: Joint aches and or swelling due to excess weight (Osteoarthritis) Musculoskeletal: Negative for myasthenia gravis, muscular dystrophy, multiple sclerosis or malignant hyperthermia Work History: Unemployed  Allergies  Ms. Gowell is allergic to codeine.  Laboratory Chemistry Profile  Renal Lab Results  Component Value Date   BUN 13 05/04/2012   CREATININE 0.62 05/04/2012   GFRAA >60 05/04/2012   GFRNONAA >60 05/04/2012     Electrolytes Lab Results  Component Value Date   NA 137 05/04/2012   K 4.6 05/04/2012   CL 102 05/04/2012   CALCIUM 9.0 05/04/2012     Hepatic Lab Results  Component Value Date   AST 22 05/07/2013   ALT 18 05/07/2013   ALBUMIN 4.6 05/07/2013   ALKPHOS 76 05/07/2013     ID No results found for: LYMEIGGIGMAB, HIV, [place], STAPHAUREUS, MRSAPCR, HCVAB, PREGTESTUR, RMSFIGG, QFVRPH1IGG, QFVRPH2IGG, LYMEIGGIGMAB   Bone No results found for: VD25OH, EX937JI9CVE, LF8101BP1, WC5852DP8, 25OHVITD1, 25OHVITD2, 25OHVITD3, TESTOFREE, TESTOSTERONE   Endocrine Lab Results   Component Value Date   GLUCOSE 100 (H) 05/04/2012     Neuropathy No results found for: VITAMINB12, FOLATE, HGBA1C, HIV   CNS No results found for: COLORCSF, APPEARCSF, RBCCOUNTCSF, WBCCSF, POLYSCSF, LYMPHSCSF, EOSCSF, PROTEINCSF, GLUCCSF, JCVIRUS, CSFOLI, IGGCSF, LABACHR, ACETBL, LABACHR, ACETBL   Inflammation (CRP: Acute  ESR: Chronic) No results found for: CRP, ESRSEDRATE, LATICACIDVEN   Rheumatology Lab Results  Component Value Date   LABURIC 12.0 (H) 06/29/2019     Coagulation Lab Results  Component Value Date   PLT 262 06/29/2019     Cardiovascular Lab Results  Component Value Date   TROPONINI < 0.02 05/04/2012   HGB 13.0 06/29/2019   HCT 37.3 06/29/2019     Screening No results found for: SARSCOV2NAA, COVIDSOURCE, STAPHAUREUS, MRSAPCR, HCVAB, HIV, PREGTESTUR   Cancer No results found for: CEA, CA125, LABCA2   Allergens No results found for: ALMOND, APPLE, ASPARAGUS, AVOCADO, BANANA, BARLEY, BASIL, B[place]LE[place], GREENBEAN, LIMABEAN, WHITEBEAN, BEEFIGE, REDBEET, BLUEBERRY, BROCCOLI, CABBAGE, MELON, CARROT, CASEIN, CASHEWNUT, CAULIFLOWER, CELERY     Note: Lab results reviewed.   PFSH  Drug: Ms. Ojo  reports no history of drug use. Alcohol:  reports current alcohol use. Tobacco:  reports that she has quit smoking. Her smoking use included cigarettes. She has a 40.00 pack-year smoking history. She has never used smokeless tobacco. Medical:  has a past medical history of Allergy, Arthritis, CHF (congestive heart failure) ([place]), Chronic bronchitis ([place]), Chronic kidney disease, Collapse of lung, COPD (chronic obstructive pulmonary disease) ([place]), History of DVT (deep vein thrombosis) (1976), Hypertension, and On home O2. Family: family history includes Heart attack in her brother and father.  Past Surgical History:  Procedure Laterality Date  . TUBAL LIGATION     Active Ambulatory Problems    Diagnosis Date Noted  . Chest pain 06/13/2012  . Palpitations 06/13/2012   . Smoking 06/13/2012  . Visit for preventive health examination 05/07/2013  . Chronic pain syndrome 04/19/2020  . Lumbar spondylosis 04/19/2020  . Lumbar degenerative disc disease 04/19/2020  . Compression fracture of thoracic vertebra ([place]) 04/19/2020  . Chronic thoracic spine pain 04/19/2020   Resolved Ambulatory Problems    Diagnosis Date Noted  . No Resolved Ambulatory Problems   Past Medical History:  Diagnosis Date  . Allergy   . Arthritis   . CHF (congestive heart failure) ([place])   . Chronic bronchitis ([place])   . Chronic kidney disease   . Collapse of lung   . COPD (chronic obstructive pulmonary disease) ([place])   . History of DVT (deep vein thrombosis) 1976  . Hypertension   . On home O2    Constitutional Exam  General appearance: Well nourished, well developed, and well hydrated. In no apparent acute distress Vitals:  04/19/20 0904  BP: (!) 141/81  Pulse: 92  Resp: 16  Temp: (!) 97 F (36.1 C)  SpO2: 100%  Weight: 153 lb (69.4 kg)  Height: 5' 2"  (1.575 m)   BMI Assessment: Estimated body mass index is 27.98 kg/m as calculated from the following:   Height as of this encounter: 5' 2"  (1.575 m).   Weight as of this encounter: 153 lb (69.4 kg).  BMI interpretation table: BMI level Category Range association with higher incidence of chronic pain  <18 kg/m2 Underweight   18.5-24.9 kg/m2 Ideal body weight   25-29.9 kg/m2 Overweight Increased incidence by 20%  30-34.9 kg/m2 Obese (Class I) Increased incidence by 68%  35-39.9 kg/m2 Severe obesity (Class II) Increased incidence by 136%  >40 kg/m2 Extreme obesity (Class III) Increased incidence by 254%   Patient's current BMI Ideal Body weight  Body mass index is 27.98 kg/m. Ideal body weight: 50.1 kg (110 lb 7.2 oz) Adjusted ideal body weight: 57.8 kg (127 lb 7.5 oz)   BMI Readings from Last 4 Encounters:  04/19/20 27.98 kg/m  09/05/19 32.61 kg/m  06/29/19 31.76 kg/m  02/21/19 32.61 kg/m   Wt Readings  from Last 4 Encounters:  04/19/20 153 lb (69.4 kg)  09/05/19 190 lb (86.2 kg)  06/29/19 185 lb (83.9 kg)  02/21/19 190 lb (86.2 kg)    Psych/Mental status: Alert, oriented x 3 (person, place, & time)       Eyes: PERLA Respiratory: No evidence of acute respiratory distress  Cervical Spine Exam  Skin & Axial Inspection: No masses, redness, edema, swelling, or associated skin lesions Alignment: Symmetrical Functional ROM: Unrestricted ROM      Stability: No instability detected Muscle Tone/Strength: Functionally intact. No obvious neuro-muscular anomalies detected. Sensory (Neurological): Unimpaired Palpation: No palpable anomalies              Upper Extremity (UE) Exam    Side: Right upper extremity  Side: Left upper extremity  Skin & Extremity Inspection: Skin color, temperature, and hair growth are WNL. No peripheral edema or cyanosis. No masses, redness, swelling, asymmetry, or associated skin lesions. No contractures.  Skin & Extremity Inspection: Skin color, temperature, and hair growth are WNL. No peripheral edema or cyanosis. No masses, redness, swelling, asymmetry, or associated skin lesions. No contractures.  Functional ROM: Unrestricted ROM          Functional ROM: Unrestricted ROM          Muscle Tone/Strength: Functionally intact. No obvious neuro-muscular anomalies detected.  Muscle Tone/Strength: Functionally intact. No obvious neuro-muscular anomalies detected.  Sensory (Neurological): Unimpaired          Sensory (Neurological): Unimpaired          Palpation: No palpable anomalies              Palpation: No palpable anomalies              Provocative Test(s):  Phalen's test: deferred Tinel's test: deferred Apley's scratch test (touch opposite shoulder):  Action 1 (Across chest): deferred Action 2 (Overhead): deferred Action 3 (LB reach): deferred   Provocative Test(s):  Phalen's test: deferred Tinel's test: deferred Apley's scratch test (touch opposite shoulder):   Action 1 (Across chest): deferred Action 2 (Overhead): deferred Action 3 (LB reach): deferred    Thoracic Spine Area Exam  Skin & Axial Inspection: No masses, redness, or swelling Alignment: Symmetrical Functional ROM: Unrestricted ROM Stability: No instability detected Muscle Tone/Strength: Functionally intact. No obvious neuro-muscular anomalies detected. Sensory (Neurological):  Unimpaired Muscle strength & Tone: No palpable anomalies  Lumbar Exam  Skin & Axial Inspection: No masses, redness, or swelling Alignment: Symmetrical Functional ROM: Unrestricted ROM       Stability: No instability detected Muscle Tone/Strength: Functionally intact. No obvious neuro-muscular anomalies detected. Sensory (Neurological): Unimpaired Palpation: No palpable anomalies       Provocative Tests: Hyperextension/rotation test: deferred today       Lumbar quadrant test (Kemp's test): deferred today       Lateral bending test: deferred today       Patrick's Maneuver: deferred today                   FABER* test: deferred today                   S-I anterior distraction/compression test: deferred today         S-I lateral compression test: deferred today         S-I Thigh-thrust test: deferred today         S-I Gaenslen's test: deferred today         *(Flexion, ABduction and External Rotation)  Gait & Posture Assessment  Ambulation: Unassisted Gait: Relatively normal for age and body habitus Posture: WNL   Lower Extremity Exam    Side: Right lower extremity  Side: Left lower extremity  Stability: No instability observed          Stability: No instability observed          Skin & Extremity Inspection: Skin color, temperature, and hair growth are WNL. No peripheral edema or cyanosis. No masses, redness, swelling, asymmetry, or associated skin lesions. No contractures.  Skin & Extremity Inspection: Skin color, temperature, and hair growth are WNL. No peripheral edema or cyanosis. No masses,  redness, swelling, asymmetry, or associated skin lesions. No contractures.  Functional ROM: Unrestricted ROM                  Functional ROM: Unrestricted ROM                  Muscle Tone/Strength: Functionally intact. No obvious neuro-muscular anomalies detected.  Muscle Tone/Strength: Functionally intact. No obvious neuro-muscular anomalies detected.  Sensory (Neurological): Unimpaired        Sensory (Neurological): Unimpaired        DTR: Patellar: deferred today Achilles: deferred today Plantar: deferred today  DTR: Patellar: deferred today Achilles: deferred today Plantar: deferred today  Palpation: No palpable anomalies  Palpation: No palpable anomalies   Assessment  Primary Diagnosis & Pertinent Problem List: The primary encounter diagnosis was Chronic pain syndrome. Diagnoses of Lumbar spondylosis, Lumbar degenerative disc disease, Compression fracture of thoracic vertebra, unspecified thoracic vertebral level, sequela, and Chronic thoracic spine pain were also pertinent to this visit.  Visit Diagnosis (New problems to examiner): 1. Chronic pain syndrome   2. Lumbar spondylosis   3. Lumbar degenerative disc disease   4. Compression fracture of thoracic vertebra, unspecified thoracic vertebral level, sequela   5. Chronic thoracic spine pain    Plan of Care (Initial workup plan)  Note: Ms. Renbarger was reminded that as per protocol, today's visit has been an evaluation only. We have not taken over the patient's controlled substance management.  General Recommendations: The pain condition that the patient suffers from is best treated with a multidisciplinary approach that involves an increase in physical activity to prevent de-conditioning and worsening of the pain cycle, as well as psychological counseling (formal and/or informal)  to address the co-morbid psychological affects of pain. Treatment will often involve judicious use of pain medications and interventional procedures to  decrease the pain, allowing the patient to participate in the physical activity that will ultimately produce long-lasting pain reductions. The goal of the multidisciplinary approach is to return the patient to a higher level of overall function and to restore their ability to perform activities of daily living.   64 year old female with a complex medical history including end-stage renal disease on hemodialysis twice a week, COPD on continuous oxygen, obstructive sleep apnea, atrial fibrillation on Xarelto, pulmonary hypertension who presents with chronic mid and low back pain that worsened after a fall in October 2020 resulting in thoracic spine compression fractures.  She is currently on gabapentin 300 mg nightly for restless leg.  She utilizes acetaminophen occasionally.  She avoids NSAIDs given her end-stage renal disease.  I discussion with the patient regarding chronic pain management strategies.  Avoid interventional pain therapies at this time as the patient is high risk to be off of Xarelto given her atrial fibrillation and pulmonary hypertension.  Consider medication management in the context of end-stage renal disease.  Can consider as needed low-dose oxycodone that the patient can take after her dialysis sessions as she has to sit for 5 hours.  Patient will need to complete serum screen and will refer to psychiatry for SUD eval.  After both of these have been completed, patient can follow-up to discuss medication plan and can sign opioid contract at that time.   Lab Orders     Drug Screen 10 W/Conf, Serum  Referral Orders     Ambulatory referral to Psychology  Pharmacological management options:  Opioid Analgesics: The patient was informed that there is no guarantee that she would be a candidate for opioid analgesics. The decision will be made following CDC guidelines. This decision will be based on the results of diagnostic studies, as well as Ms. Lewan risk profile.   Membrane stabilizer:  Adequate regimen gabapentin 300 mg nightly  Muscle relaxant: To be determined at a later time  NSAID: Medically contraindicated end-stage renal disease  Other analgesic(s): To be determined at a later time    Provider-requested follow-up: Return for After Psychological evaluation.  No future appointments.  Note by: Gillis Santa, MD Date: 04/19/2020; Time: 11:30 AM

## 2020-04-19 NOTE — Patient Instructions (Signed)

## 2020-04-22 LAB — DRUG SCREEN 10 W/CONF, SERUM
Amphetamines, IA: NEGATIVE ng/mL
Barbiturates, IA: NEGATIVE ug/mL
Benzodiazepines, IA: NEGATIVE ng/mL
Cocaine & Metabolite, IA: NEGATIVE ng/mL
Methadone, IA: NEGATIVE ng/mL
Opiates, IA: NEGATIVE ng/mL
Oxycodones, IA: NEGATIVE ng/mL
Phencyclidine, IA: NEGATIVE ng/mL
Propoxyphene, IA: NEGATIVE ng/mL
THC(Marijuana) Metabolite, IA: NEGATIVE ng/mL

## 2020-06-08 ENCOUNTER — Telehealth (INDEPENDENT_AMBULATORY_CARE_PROVIDER_SITE_OTHER): Payer: Medicare Other | Admitting: Psychiatry

## 2020-06-08 ENCOUNTER — Other Ambulatory Visit: Payer: Self-pay

## 2020-06-08 NOTE — Progress Notes (Signed)
ERROR

## 2020-06-16 DIAGNOSIS — N184 Chronic kidney disease, stage 4 (severe): Secondary | ICD-10-CM | POA: Insufficient documentation

## 2020-06-16 DIAGNOSIS — R0609 Other forms of dyspnea: Secondary | ICD-10-CM | POA: Insufficient documentation

## 2020-06-25 IMAGING — CR DG LUMBAR SPINE COMPLETE 4+V
5 series · 5 of 5 positions shown · non-contrast
Comparison: None.

CLINICAL DATA: Fall, back/rib pain

EXAM:
LUMBAR SPINE - COMPLETE 4+ VIEW

[l-spine ap]
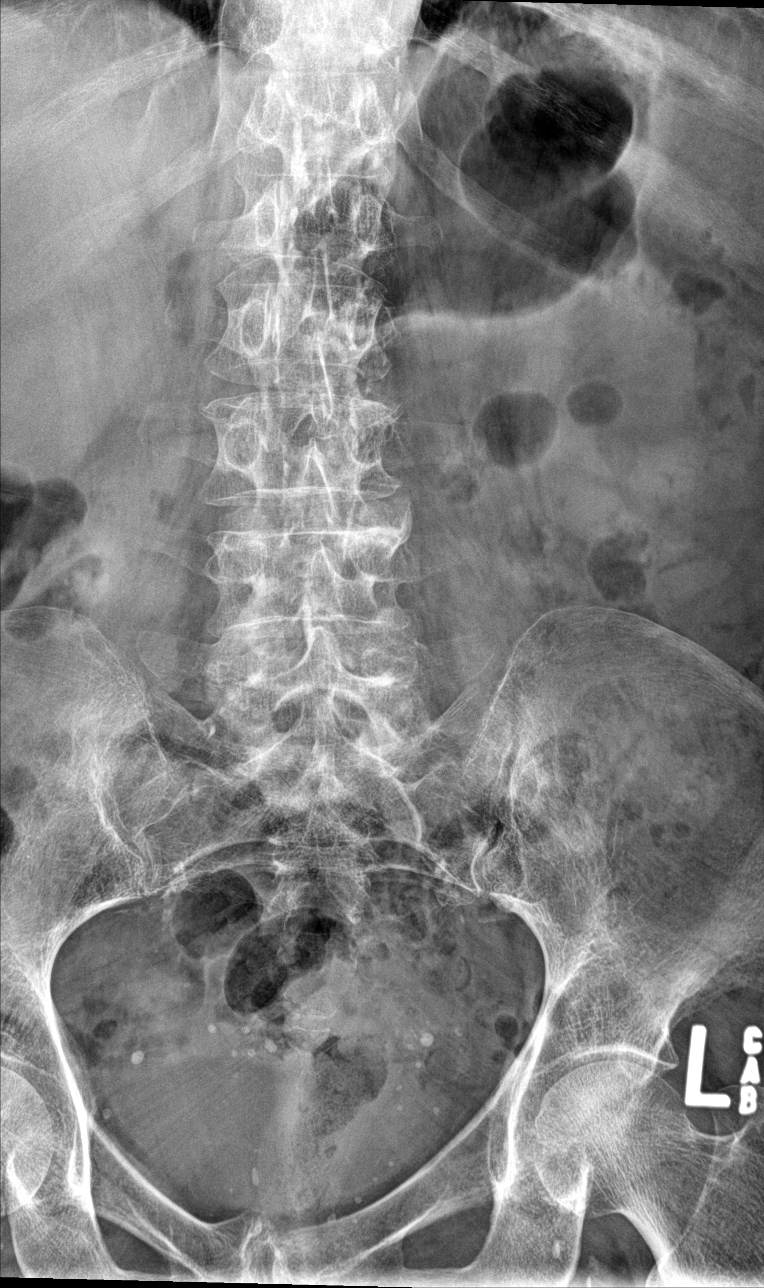

[l-spine obl (1 of 2)]
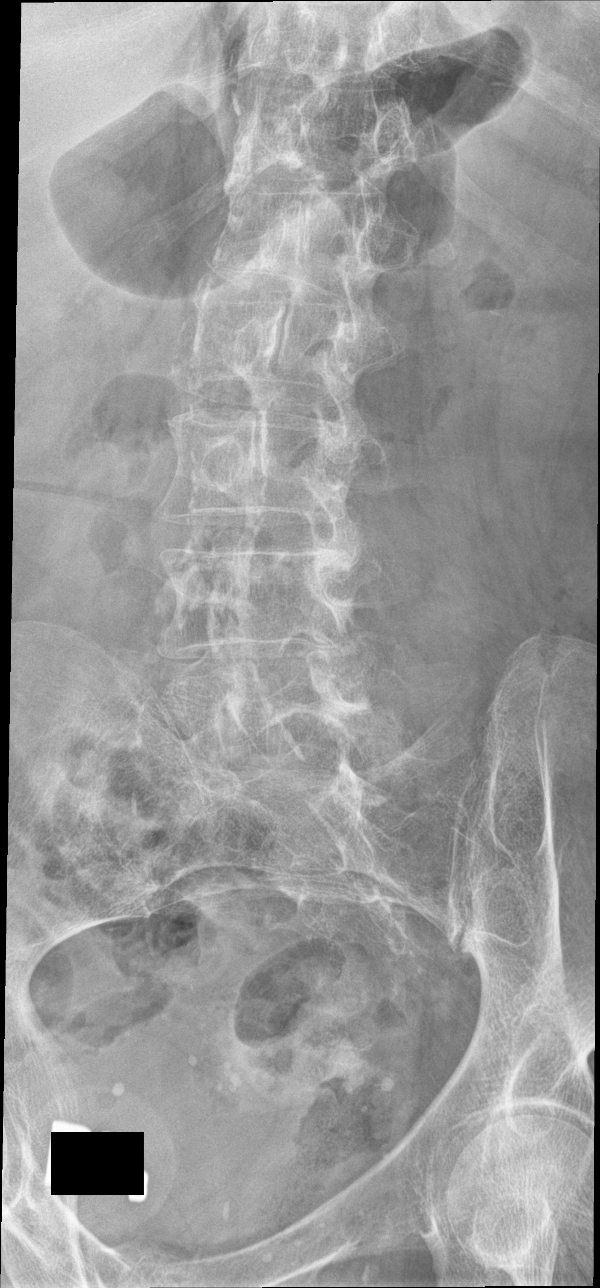

[l-spine obl (2 of 2)]
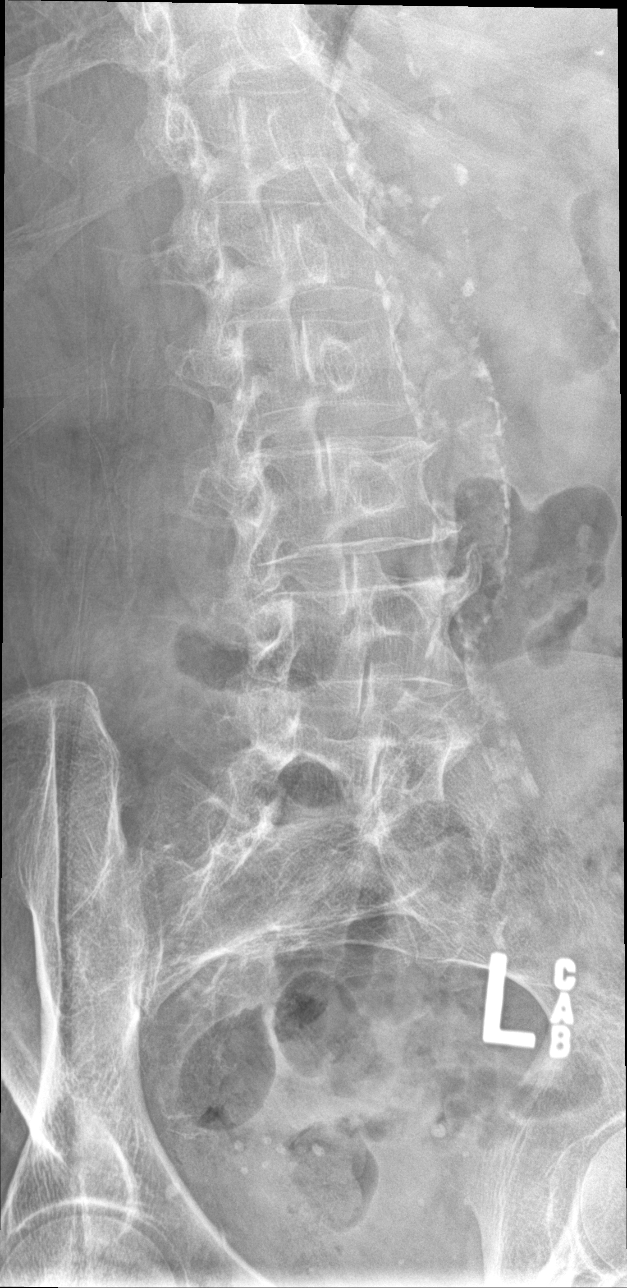

[l-spine lat]
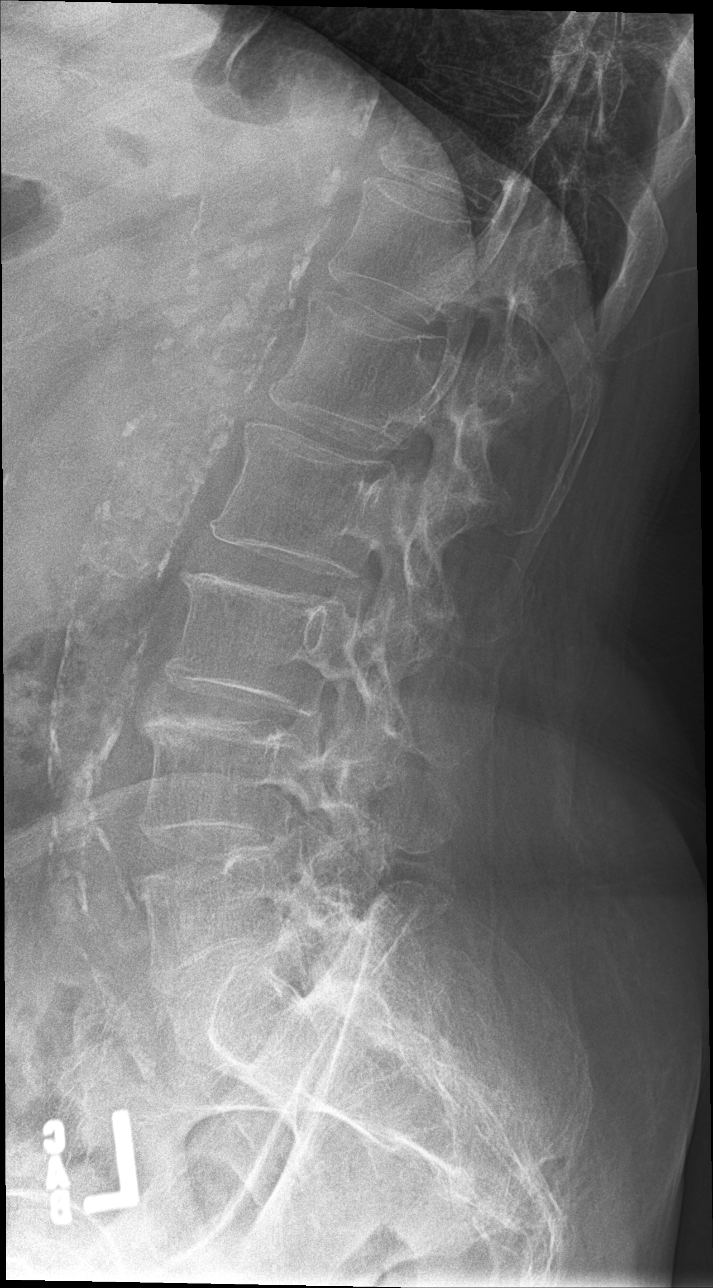

[l-spine spot]
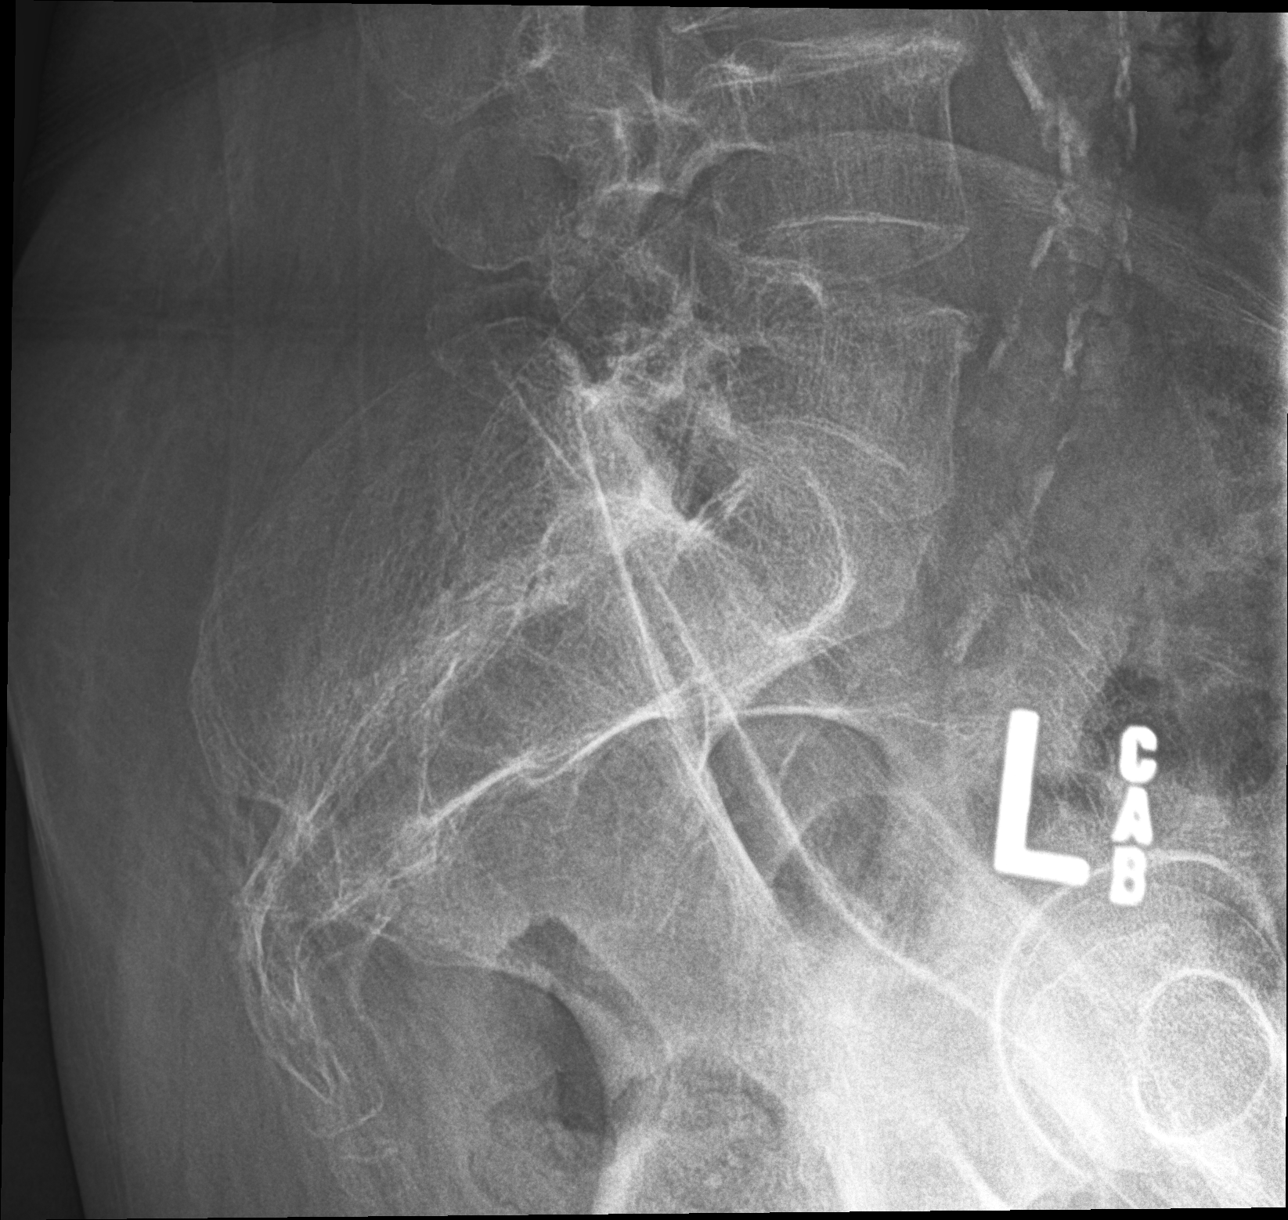

[5 of 5 positions shown; findings below may reference images not displayed]

FINDINGS: Five lumbar-type vertebral bodies.

Normal lumbar lordosis.

No evidence of fracture or dislocation. Vertebral body heights and
intervertebral disc spaces are maintained.

Mild degenerative changes of the lower lumbar spine.

Visualized bony pelvis appears intact.

Vascular calcifications.
IMPRESSION: Negative.

## 2020-07-08 ENCOUNTER — Telehealth (INDEPENDENT_AMBULATORY_CARE_PROVIDER_SITE_OTHER): Payer: Medicare Other | Admitting: Psychiatry

## 2020-07-08 ENCOUNTER — Encounter: Payer: Self-pay | Admitting: Psychiatry

## 2020-07-08 DIAGNOSIS — G894 Chronic pain syndrome: Secondary | ICD-10-CM | POA: Diagnosis not present

## 2020-07-08 DIAGNOSIS — N186 End stage renal disease: Secondary | ICD-10-CM | POA: Insufficient documentation

## 2020-07-08 DIAGNOSIS — Z1330 Encounter for screening examination for mental health and behavioral disorders, unspecified: Secondary | ICD-10-CM

## 2020-07-08 DIAGNOSIS — Z515 Encounter for palliative care: Secondary | ICD-10-CM | POA: Insufficient documentation

## 2020-07-08 DIAGNOSIS — G4701 Insomnia due to medical condition: Secondary | ICD-10-CM

## 2020-07-08 DIAGNOSIS — Z008 Encounter for other general examination: Secondary | ICD-10-CM

## 2020-07-08 NOTE — Progress Notes (Addendum)
Provider Location : [facility] Patient Location : Home  Virtual Visit via Video Note  I connected with Kellie Morris on 07/08/20 at 12:00 PM EDT by a video enabled telemedicine application and verified that I am speaking with the correct person using two identifiers.   I discussed the limitations of evaluation and management by telemedicine and the availability of in person appointments. The patient expressed understanding and agreed to proceed.    I discussed the assessment and treatment plan with the patient. The patient was provided an opportunity to ask questions and all were answered. The patient agreed with the plan and demonstrated an understanding of the instructions.   The patient was advised to call back or seek an in-person evaluation if the symptoms worsen or if the condition fails to improve as anticipated.    Psychiatric Initial Adult Assessment   Patient Identification: Kellie Morris MRN:  [id number] Date of Evaluation:  07/08/2020 Referral Source: Dr.Bilal Lateef Chief Complaint:   Chief Complaint    Psychiatric Evaluation     Visit Diagnosis:    ICD-10-CM   1. Evaluation by psychiatric service required  Z00.8   2. Chronic pain syndrome  G89.4   3. Insomnia due to medical condition  G47.01    pain    History of Present Illness:  Kellie Morris is a 64 year old Caucasian female, on SSI, currently lives with her long-term boyfriend of 61 years, lives in [place], has a history of chronic pain syndrome, lumbar spondylosis, lumbar degenerative disc disease, history of compression fracture of thoracic vertebra and chronic thoracic spine pain, COPD on continuous oxygen, obstructive sleep apnea, atrial fibrillation, pulmonary hypertension, end-stage renal disease currently not on hemodialysis was evaluated by telemedicine today.  Patient was referred to the clinic for a routine assessment of possible mental health/substance abuse risk potential by her pain provider prior to initiation of  pain management.  Patient today appeared to be alert, oriented to person place and time.  She was able to answer all questions appropriately.  Patient reports that she had a fall a year ago.  She reports she had compression fracture and required hospitalization at that time.  She reports that ever since she has been struggling with severe back pain.  She reports her pain is exacerbated by walking too long, any kind of activity, bending, trying to lay down flat and so on.  She reports her current pain is at a 7 out of 10, 10 being the worst.  She reports trying to rest often, not laying flat on her bed, as well as taking medications like Tylenol as needed as well as gabapentin helps her to some extent with her pain.  Patient reports she is currently unable to sleep at night much because of the pain.  She gets around 3 to 4 hours and that too by sleeping on a recliner.  She cannot lay flat on her back.  She has tried medications like trazodone however after trying trazodone 50 mg she stopped taking it since she did not feel any benefit.  She reports she currently has melatonin available however nothing helps because of her pain.  Patient currently denies any depressive symptoms.  Patient denies any anhedonia, crying spells, sadness, suicidality.  Patient denies any anxiety symptoms.  Patient denies any mood lability.  Patient denies any history of trauma.  Patient denies any homicidality or perceptual disturbances.  Patient denies any substance abuse problems.  Patient denies abusing or misusing her medications in the past.  Patient denies  any legal problems.  She currently lives with her long-term boyfriend of 23 years who is very supportive.  She also has a son and a daughter who are supportive.   Associated Signs/Symptoms: Depression Symptoms:  Denies (Hypo) Manic Symptoms:  Denies Anxiety Symptoms:  Denies Psychotic Symptoms:  Denies PTSD Symptoms: Negative  Past Psychiatric  History: Patient denies inpatient mental health admissions.  Patient denies past psychiatric diagnosis or treatment.  Patient denies suicide attempts.  Previous Psychotropic Medications: No   Substance Abuse History in the last 12 months:  No.  Consequences of Substance Abuse: Negative  Past Medical History:  Past Medical History:  Diagnosis Date  . Allergy   . Arthritis   . CHF (congestive heart failure) ([place])   . Chronic bronchitis ([place])   . Chronic kidney disease   . Collapse of lung   . COPD (chronic obstructive pulmonary disease) ([place])   . History of DVT (deep vein thrombosis) 1976  . Hypertension   . On home O2     Past Surgical History:  Procedure Laterality Date  . TUBAL LIGATION      Family Psychiatric History: Father-history of alcoholism.  Family History:  Family History  Problem Relation Age of Onset  . Heart attack Father   . Alcohol abuse Father   . Heart attack Brother     Social History:   Social History   Socioeconomic History  . Marital status: Widowed    Spouse name: Not on file  . Number of children: Not on file  . Years of education: Not on file  . Highest education level: 9th grade  Occupational History  . Not on file  Tobacco Use  . Smoking status: Former Smoker    Packs/day: 1.00    Years: 40.00    Pack years: 40.00    Types: Cigarettes  . Smokeless tobacco: Never Used  Vaping Use  . Vaping Use: Never used  Substance and Sexual Activity  . Alcohol use: Yes    Comment:  daily 1/2 pint liquor  . Drug use: No  . Sexual activity: Not on file  Other Topics Concern  . Not on file  Social History Narrative  . Not on file   Social Determinants of Health   Financial Resource Strain:   . Difficulty of Paying Living Expenses:   Food Insecurity:   . Worried About Charity fundraiser in the Last Year:   . Arboriculturist in the Last Year:   Transportation Needs:   . Film/video editor (Medical):   Marland Kitchen Lack of Transportation  (Non-Medical):   Physical Activity:   . Days of Exercise per Week:   . Minutes of Exercise per Session:   Stress:   . Feeling of Stress :   Social Connections:   . Frequency of Communication with Friends and Family:   . Frequency of Social Gatherings with Friends and Family:   . Attends Religious Services:   . Active Member of Clubs or Organizations:   . Attends Archivist Meetings:   Marland Kitchen Marital Status:     Additional Social History: Patient was born and raised in [place] by her parents.  She reports her mother took care of her and her father was in [employer].  She has 5 siblings, all brothers.  She went up to ninth grade.  She has worked several jobs and most recently at [employer], currently retired.  She is currently on SSI.  She was married  in the past but widowed.  She currently lives in [place] with her long-term boyfriend of 23 years who is very supportive.  She has a son and a daughter who are supportive.  Patient denies any legal problems.  Patient denies any history of trauma.  Allergies:   Allergies  Allergen Reactions  . Codeine     Metabolic Disorder Labs: No results found for: HGBA1C, MPG No results found for: PROLACTIN Lab Results  Component Value Date   CHOL 172 05/07/2013   TRIG 83 05/07/2013   HDL 63 05/07/2013   CHOLHDL 2.7 05/07/2013   LDLCALC 92 05/07/2013   No results found for: TSH  Therapeutic Level Labs: No results found for: LITHIUM No results found for: CBMZ No results found for: VALPROATE  Current Medications: Current Outpatient Medications  Medication Sig Dispense Refill  . acetaminophen (TYLENOL) 500 MG tablet Take by mouth.    Marland Kitchen apixaban (ELIQUIS) 2.5 MG TABS tablet TAKE 1 TABLET (2.5 MG TOTAL) BY MOUTH TWO (2) TIMES A DAY.    Marland Kitchen Aspirin-Salicylamide-Caffeine (BC HEADACHE POWDER PO) Take by mouth daily.    . diclofenac Sodium (VOLTAREN) 1 % GEL Apply topically.    . Fluticasone-Salmeterol (ADVAIR DISKUS) 250-50 MCG/DOSE  AEPB Inhale into the lungs.    . furosemide (LASIX) 80 MG tablet Take by mouth.    . gabapentin (NEURONTIN) 300 MG capsule Take by mouth.    Marland Kitchen ipratropium (ATROVENT HFA) 17 MCG/ACT inhaler Inhale into the lungs.    Marland Kitchen ipratropium-albuterol (DUONEB) 0.5-2.5 (3) MG/3ML SOLN Inhale into the lungs.    Marland Kitchen lisinopril (PRINIVIL,ZESTRIL) 10 MG tablet Take 10 mg by mouth daily.    Marland Kitchen lovastatin (MEVACOR) 20 MG tablet Take 1 tablet (20 mg total) by mouth at bedtime. 90 tablet 3  . lovastatin (MEVACOR) 40 MG tablet     . melatonin 3 MG TABS tablet Take by mouth.    . metoprolol succinate (TOPROL-XL) 100 MG 24 hr tablet Take by mouth.    . metoprolol tartrate (LOPRESSOR) 100 MG tablet Take by mouth.    . metoprolol tartrate (LOPRESSOR) 25 MG tablet Take 1 tablet (25 mg total) by mouth 2 (two) times daily. 180 tablet 3  . Multiple Vitamin (MULTIVITAMIN) tablet Take 1 tablet by mouth daily.    Marland Kitchen omeprazole (PRILOSEC) 40 MG capsule Take by mouth.    . Prenatal Vit-Fe Fumarate-FA (NIVA-PLUS) 27-1 MG TABS Take by mouth.    . Prenatal Vit-Fe Fumarate-FA (NIVA-PLUS) 27-1 MG TABS Take 1 tablet by mouth daily.    . sennosides-docusate sodium (SENOKOT-S) 8.6-50 MG tablet Take 1 tablet by mouth 2 (two) times daily.    Marland Kitchen thiamine 100 MG tablet Take by mouth.    . tiotropium (SPIRIVA HANDIHALER) 18 MCG inhalation capsule Place into inhaler and inhale.    . furosemide (LASIX) 20 MG tablet Take by mouth.    . nitroGLYCERIN (NITROSTAT) 0.4 MG SL tablet Place 1 tablet (0.4 mg total) under the tongue every 5 (five) minutes as needed for chest pain. 25 tablet 6  . rivaroxaban (XARELTO) 20 MG TABS tablet Take by mouth.     No current facility-administered medications for this visit.    Musculoskeletal: Strength & Muscle Tone: UTA Gait & Station: normal Patient leans: N/A  Psychiatric Specialty Exam: Review of Systems  Musculoskeletal: Positive for back pain.  Psychiatric/Behavioral: Positive for sleep disturbance.   All other systems reviewed and are negative.   There were no vitals taken for this visit.There is no  height or weight on file to calculate BMI.  General Appearance: Casual  Eye Contact:  Fair  Speech:  Clear and Coherent  Volume:  Normal  Mood:  Euthymic  Affect:  Congruent  Thought Process:  Goal Directed and Descriptions of Associations: Intact  Orientation:  Full (Time, Place, and Person)  Thought Content:  Logical  Suicidal Thoughts:  No  Homicidal Thoughts:  No  Memory:  Immediate;   Fair Recent;   Fair Remote;   Fair  Judgement:  Fair  Insight:  Fair  Psychomotor Activity:  Normal  Concentration:  Concentration: Fair and Attention Span: Fair  Recall:  [employer] of Knowledge:Fair  Language: Fair  Akathisia:  No  Handed:  Right  AIMS (if indicated):  UTA  Assets:  Communication Skills Desire for Improvement Housing Intimacy Social Support Transportation  ADL's:  Intact  Cognition: WNL  Sleep:  Poor due to pain   Screenings:   Assessment and Plan: Ivry Pigue is a 64 year old Caucasian female, lives in [place] with her boyfriend, on SSI, has a history of chronic pain syndrome, lumbar spondylosis, lumbar degenerative disc disease, compression fracture of thoracic vertebrae, COPD, atrial fibrillation and multiple other medical problems was evaluated by telemedicine today.  Patient was referred to the clinic for a routine assessment of possible mental health/substance abuse risk potential prior to initiation of pain management.  Instruments used Clinical interview Screener and opioid assessment for patients with pain/revised Opioid risk tool Drug abuse screening test Alcohol use disorder identification test PHQ-9 GAD 7   I have reviewed medical records in [facility] dated 04/19/2020 per Dr. Seth Bake was seen for initial consult for pain management.  I have reviewed [place] PMP controlled substance database.  I have reviewed the following labs-drug screen 10-dated  04/19/2020.  Based on clinical interview as well as instruments used, and review of records the risk is determined to be low.  Insomnia-secondary to pain-unstable Provided education to patient.  She will have a discussion with her provider regarding reinitiating trazodone and probably increasing the dosage since she tolerated it well in the past.  Sufficient pain management is important since her sleep problems are due to her pain.  I have spent atleast 50 minutes non face to face with patient today. More than 50 % of the time was spent for preparing to see the patient ( e.g., review of test, records ), obtaining and to review and separately obtained history ,psychoeducation and supportive psychotherapy and care coordination,as well as documenting clinical information in electronic health record.    Ursula Alert, MD 8/6/20211:07 PM

## 2020-08-19 ENCOUNTER — Ambulatory Visit
Admission: RE | Admit: 2020-08-19 | Discharge: 2020-08-19 | Disposition: A | Discharge status: Home / Self Care | Payer: Medicare Other | Source: Ambulatory Visit | Attending: Student in an Organized Health Care Education/Training Program | Admitting: Student in an Organized Health Care Education/Training Program

## 2020-08-19 MED ORDER — SODIUM CHLORIDE 0.9% IV SOLUTION: Freq: Once | INTRAVENOUS | Status: DC

## 2020-08-19 NOTE — Progress Notes (Signed)
Pt did not show for blood transfusion appointment today. Upon record review patient, appears to be currently admitted within [facility] system.

## 2020-09-04 ENCOUNTER — Other Ambulatory Visit: Payer: Self-pay

## 2020-09-04 ENCOUNTER — Ambulatory Visit
Admission: EM | Admit: 2020-09-04 | Discharge: 2020-09-04 | Disposition: A | Discharge status: Home / Self Care | Payer: Medicare Other | Attending: Internal Medicine | Admitting: Internal Medicine

## 2020-09-04 ENCOUNTER — Encounter: Payer: Self-pay | Admitting: Gynecology

## 2020-09-04 DIAGNOSIS — B029 Zoster without complications: Secondary | ICD-10-CM | POA: Diagnosis not present

## 2020-09-04 MED ORDER — GABAPENTIN 300 MG PO CAPS: 300.0000 mg | ORAL_CAPSULE | Freq: Two times a day (BID) | ORAL | 0 refills | Status: DC

## 2020-09-04 MED ORDER — VALACYCLOVIR HCL 1 G PO TABS: 1000.0000 mg | ORAL_TABLET | Freq: Three times a day (TID) | ORAL | 0 refills | Status: DC

## 2020-09-04 NOTE — ED Provider Notes (Signed)
[facility]    CSN: [id number] Arrival date & time: 09/04/20  1509      History   Chief Complaint Chief Complaint  Patient presents with  . Herpes Zoster    HPI Kellie Morris is a 64 y.o. female who presents today for 2-day history of rash.  The patient noticed a rash began to appear along the right side of her abdomen.  The patient reports that the rash is burning and tingling.  The rash is painful.  She has been applying a hydrocortisone cream to the rash but has not noticed any improvement.  Presents today because the patient was concerned that it may be shingles.  HPI  Past Medical History:  Diagnosis Date  . Allergy   . Arthritis   . CHF (congestive heart failure) ([place])   . Chronic bronchitis ([place])   . Chronic kidney disease   . Collapse of lung   . COPD (chronic obstructive pulmonary disease) ([place])   . History of DVT (deep vein thrombosis) 1976  . Hypertension   . On home O2     Patient Active Problem List   Diagnosis Date Noted  . Palliative care by specialist 07/08/2020  . ESRD (end stage renal disease) ([place]) 07/08/2020  . Stage 4 chronic kidney disease ([place]) 06/16/2020  . Dyspnea on exertion 06/16/2020  . Chronic pain syndrome 04/19/2020  . Lumbar spondylosis 04/19/2020  . Lumbar degenerative disc disease 04/19/2020  . Compression fracture of thoracic vertebra ([place]) 04/19/2020  . Chronic thoracic spine pain 04/19/2020  . Unspecified protein-calorie malnutrition ([place]) 01/14/2020  . Abnormality of albumin 01/14/2020  . Acute right-sided low back pain without sciatica 01/13/2020  . Allergy, unspecified, initial encounter 01/07/2020  . Other fluid overload 12/23/2019  . Iron deficiency anemia, unspecified 12/20/2019  . Anaphylactic shock, unspecified, initial encounter 12/11/2019  . Acute kidney failure, unspecified ([place]) 12/11/2019  . 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 ([place]) 12/10/2019  . BiPAP (biphasic positive airway pressure) dependence 12/10/2019    . Oxygen dependent 12/03/2019  . Pain, unspecified 11/25/2019  . Aftercare following surgery of the respiratory system 11/25/2019  . Pleural effusion 11/08/2019  . Gastritis with hemorrhage 10/27/2019  . Obesity (BMI 30-39.9) 10/27/2019  . Other specified nutritional deficiencies 10/08/2019  . Hyponatremia 10/08/2019  . Coagulation defect, unspecified ([place]) 10/08/2019  . Elevated LFTs 10/07/2019  . Anemia 10/07/2019  . Chronic right-sided heart failure ([place]) 03/11/2019  . Pulmonary hypertension ([place]) 03/19/2018  . Patent foramen ovale 03/05/2018  . Diastolic dysfunction, left ventricle 02/22/2018  . Pulmonary nodules 12/04/2017  . Hyperlipidemia 04/19/2017  . Restless legs syndrome 12/29/2015  . Insomnia 12/29/2015  . Dyslipidemia 04/02/2015  . Chronic obstructive pulmonary disease, unspecified ([place]) 04/02/2015  . Acute and chronic respiratory failure with hypoxia ([place]) 04/02/2015  . Atrial fibrillation with rapid ventricular response ([place]) 03/26/2015  . Alcohol abuse, uncomplicated 83/66/2947  . Visit for preventive health examination 05/07/2013  . Chest pain 06/13/2012  . Palpitations 06/13/2012  . Smoking 06/13/2012    Past Surgical History:  Procedure Laterality Date  . TUBAL LIGATION      OB History   No obstetric history on file.      Home Medications    Prior to Admission medications   Medication Sig Start Date End Date Taking? Authorizing Provider  acetaminophen (TYLENOL) 500 MG tablet Take by mouth.   Yes [provider]  apixaban (ELIQUIS) 2.5 MG TABS tablet TAKE 1 TABLET (2.5 MG TOTAL) BY MOUTH TWO (2) TIMES A DAY.  05/27/20  Yes [provider]  diclofenac Sodium (VOLTAREN) 1 % GEL Apply topically. 01/22/20  Yes [provider]  Fluticasone-Salmeterol (ADVAIR DISKUS) 250-50 MCG/DOSE AEPB Inhale into the lungs. 01/11/20  Yes [provider]  ipratropium (ATROVENT HFA) 17 MCG/ACT inhaler Inhale into the lungs.   Yes [provider]  ipratropium-albuterol (DUONEB) 0.5-2.5 (3) MG/3ML SOLN Inhale into the lungs. 02/29/20 02/28/21 Yes [provider]  lovastatin (MEVACOR) 20 MG tablet Take 1 tablet (20 mg total) by mouth at bedtime. 05/07/14  Yes Minna Merritts, MD  metoprolol tartrate (LOPRESSOR) 100 MG tablet Take 50 mg by mouth.  06/18/17  Yes [provider]  Multiple Vitamin (MULTIVITAMIN) tablet Take 1 tablet by mouth daily.   Yes [provider]  sennosides-docusate sodium (SENOKOT-S) 8.6-50 MG tablet Take 1 tablet by mouth 2 (two) times daily. 01/11/20  Yes [provider]  thiamine 100 MG tablet Take by mouth. 11/25/19  Yes [provider]  tiotropium (SPIRIVA HANDIHALER) 18 MCG inhalation capsule Place into inhaler and inhale. 01/11/20  Yes [provider]  furosemide (LASIX) 20 MG tablet Take by mouth. 08/06/17 09/05/19  [provider]  furosemide (LASIX) 80 MG tablet Take by mouth. 06/19/20 07/19/20  [provider]  gabapentin (NEURONTIN) 300 MG capsule Take 1 capsule (300 mg total) by mouth 2 (two) times daily. 09/04/20   Lattie Corns, PA-C  lovastatin (MEVACOR) 40 MG tablet  05/01/20   [provider]  melatonin 3 MG TABS tablet Take by mouth.    [provider]  metoprolol succinate (TOPROL-XL) 100 MG 24 hr tablet Take by mouth. 06/19/20 07/19/20  [provider]  nitroGLYCERIN (NITROSTAT) 0.4 MG SL tablet Place 1 tablet (0.4 mg total) under the tongue every 5 (five) minutes as needed for chest pain. 06/04/13 12/14/17  Minna Merritts, MD  Prenatal Vit-Fe Fumarate-FA (NIVA-PLUS) 27-1 MG TABS Take 1 tablet by mouth daily. 01/11/20   [provider]  rivaroxaban (XARELTO) 20 MG TABS tablet Take by mouth. 05/15/18 09/05/19  [provider]  valACYclovir (VALTREX) 1000 MG tablet Take 1 tablet (1,000 mg total) by mouth 3 (three) times daily. 09/04/20   Lattie Corns, PA-C  lisinopril  (PRINIVIL,ZESTRIL) 10 MG tablet Take 10 mg by mouth daily.  09/04/20  [provider]  omeprazole (PRILOSEC) 40 MG capsule Take by mouth. 12/30/19 09/04/20  [provider]    Family History Family History  Problem Relation Age of Onset  . Heart attack Father   . Alcohol abuse Father   . Heart attack Brother     Social History Social History   Tobacco Use  . Smoking status: Former Smoker    Packs/day: 1.00    Years: 40.00    Pack years: 40.00    Types: Cigarettes  . Smokeless tobacco: Never Used  Vaping Use  . Vaping Use: Never used  Substance Use Topics  . Alcohol use: Yes    Comment:  daily 1/2 pint liquor  . Drug use: No     Allergies   Codeine   Review of Systems Review of Systems  Skin: Positive for rash.  All other systems reviewed and are negative.  Physical Exam Triage Vital Signs ED Triage Vitals  Enc Vitals Group     BP 09/04/20 1623 108/60     Pulse Rate 09/04/20 1623 85     Resp 09/04/20 1623 16     Temp 09/04/20 1623 98.1 F (36.7 C)  Temp Source 09/04/20 1623 Oral     SpO2 09/04/20 1623 100 %     Weight 09/04/20 1624 173 lb (78.5 kg)     Height 09/04/20 1624 5\' 4"  (1.626 m)     Head Circumference --      Peak Flow --      Pain Score 09/04/20 1624 4     Pain Loc --      Pain Edu? --      Excl. in [place]? --    No data found.  Updated Vital Signs BP 108/60 (BP Location: Left Arm)   Pulse 85   Temp 98.1 F (36.7 C) (Oral)   Resp 16   Ht 5\' 4"  (1.626 m)   Wt 173 lb (78.5 kg)   SpO2 100%   BMI 29.70 kg/m   Visual Acuity Right Eye Distance:   Left Eye Distance:   Bilateral Distance:    Right Eye Near:   Left Eye Near:    Bilateral Near:     Physical Exam Skin examination of the right abdomen does reveal a vesicular rash in a dermatomal pattern consistent with shingles. UC Treatments / Results  Labs (all labs ordered are listed, but only abnormal results are displayed) Labs Reviewed - No data to  display  EKG   Radiology No results found.  Procedures Procedures (including critical care time)  Medications Ordered in UC Medications - No data to display  Initial Impression / Assessment and Plan / UC Course  I have reviewed the triage vital signs and the nursing notes.  Pertinent labs & imaging results that were available during my care of the patient were reviewed by me and considered in my medical decision making (see chart for details).     1.  Treatment options were discussed today with the patient. 2.  The patient will increase her gabapentin to 300 mg twice daily.  Valacyclovir also prescribed. 3.  Follow-up with primary care physician if no improvement. Final Clinical Impressions(s) / UC Diagnoses   Final diagnoses:  Herpes zoster without complication     Discharge Instructions     -Take medications as directed.   ED Prescriptions    Medication Sig Dispense Auth. Provider   gabapentin (NEURONTIN) 300 MG capsule Take 1 capsule (300 mg total) by mouth 2 (two) times daily. 20 capsule Lattie Corns, PA-C   valACYclovir (VALTREX) 1000 MG tablet Take 1 tablet (1,000 mg total) by mouth 3 (three) times daily. 21 tablet Lattie Corns, PA-C     PDMP not reviewed this encounter.   Lattie Corns, PA-C 09/04/20 1652

## 2020-09-04 NOTE — Discharge Instructions (Signed)
Take medications as directed

## 2020-09-04 NOTE — ED Triage Notes (Signed)
Patient c/o shingles at right side abdomen x 2 days.

## 2020-10-12 ENCOUNTER — Other Ambulatory Visit: Payer: Self-pay

## 2020-10-12 ENCOUNTER — Ambulatory Visit
Payer: Medicare Other | Attending: Student in an Organized Health Care Education/Training Program | Admitting: Student in an Organized Health Care Education/Training Program

## 2020-10-12 ENCOUNTER — Encounter: Payer: Self-pay | Admitting: Student in an Organized Health Care Education/Training Program

## 2020-10-12 VITALS — BP 108/70 | HR 97 | Temp 98.3°F | Resp 18 | Ht 64.0 in | Wt 167.0 lb

## 2020-10-12 DIAGNOSIS — G894 Chronic pain syndrome: Secondary | ICD-10-CM | POA: Insufficient documentation

## 2020-10-12 DIAGNOSIS — S22000S Wedge compression fracture of unspecified thoracic vertebra, sequela: Secondary | ICD-10-CM | POA: Diagnosis not present

## 2020-10-12 DIAGNOSIS — G8929 Other chronic pain: Secondary | ICD-10-CM | POA: Insufficient documentation

## 2020-10-12 DIAGNOSIS — M5136 Other intervertebral disc degeneration, lumbar region: Secondary | ICD-10-CM | POA: Diagnosis not present

## 2020-10-12 DIAGNOSIS — M47816 Spondylosis without myelopathy or radiculopathy, lumbar region: Secondary | ICD-10-CM | POA: Diagnosis not present

## 2020-10-12 DIAGNOSIS — M546 Pain in thoracic spine: Secondary | ICD-10-CM | POA: Diagnosis not present

## 2020-10-12 DIAGNOSIS — Z0289 Encounter for other administrative examinations: Secondary | ICD-10-CM | POA: Insufficient documentation

## 2020-10-12 MED ORDER — OXYCODONE HCL 5 MG PO TABS
5.0000 mg | ORAL_TABLET | Freq: Every day | ORAL | 0 refills | Status: AC | PRN
Start: 2020-11-11 — End: 2020-12-11

## 2020-10-12 MED ORDER — OXYCODONE HCL 5 MG PO TABS
5.0000 mg | ORAL_TABLET | Freq: Every day | ORAL | 0 refills | Status: AC | PRN
Start: 2020-10-12 — End: 2020-11-11

## 2020-10-12 NOTE — Progress Notes (Signed)
Safety precautions to be maintained throughout the outpatient stay will include: orient to surroundings, keep bed in low position, maintain call bell within reach at all times, provide assistance with transfer out of bed and ambulation.  

## 2020-10-12 NOTE — Patient Instructions (Signed)
1. Sign pain contract 2. F/U 2 months

## 2020-10-12 NOTE — Progress Notes (Signed)
PROVIDER NOTE: Information contained herein reflects review and annotations entered in association with encounter. Interpretation of such information and data should be left to medically-trained personnel. Information provided to patient can be located elsewhere in the medical record under "Patient Instructions". Document created using STT-dictation technology, any transcriptional errors that may result from process are unintentional.    Patient: Kellie Morris  Service Category: E/M  Provider: Gillis Santa, MD  DOB: [birth year]  DOS: 10/12/2020  Specialty: Interventional Pain Management  MRN: [id number]  Setting: Ambulatory outpatient  PCP: [facility]  Type: Established Patient    Referring Provider: [facility]  Location: Office  Delivery: Face-to-face     Primary Reason(s) for Visit: Encounter for evaluation before starting new chronic pain management plan of care (Level of risk: moderate) CC: Back Pain (low and mid)  HPI  [name] is a 64 y.o. year old, female patient, who comes today for a follow-up evaluation to review the test results and decide on a treatment plan. She has Chest pain; Palpitations; Smoking; Visit for preventive health examination; Chronic pain syndrome; Lumbar spondylosis; Lumbar degenerative disc disease; Compression fracture of thoracic vertebra ([place]); Chronic thoracic spine pain; Unspecified protein-calorie malnutrition ([place]); Stage 4 chronic kidney disease ([place]); Restless legs syndrome; Pulmonary nodules; Pulmonary hypertension ([place]); Pleural effusion; Patent foramen ovale; Palliative care by specialist; Pain, unspecified; Oxygen dependent; Other specified nutritional deficiencies; Other fluid overload; Iron deficiency anemia, unspecified; Insomnia; Hyponatremia; Hyperlipidemia; Gastritis with hemorrhage; ESRD (end stage renal disease) ([place]); Elevated LFTs; Dyspnea on exertion; Dyslipidemia; Diastolic dysfunction, left ventricle; 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  ([place]); Chronic obstructive pulmonary disease, unspecified ([place]); Coagulation defect, unspecified ([place]); Chronic right-sided heart failure ([place]); BiPAP (biphasic positive airway pressure) dependence; Atrial fibrillation with rapid ventricular response ([place]); Anemia; Anaphylactic shock, unspecified, initial encounter; Allergy, unspecified, initial encounter; Alcohol abuse, uncomplicated; Aftercare following surgery of the respiratory system; Acute right-sided low back pain without sciatica; Acute kidney failure, unspecified ([place]); Acute and chronic respiratory failure with hypoxia ([place]); Abnormality of albumin; Obesity (BMI 30-39.9); and Pain management contract signed on their problem list. Her primarily concern today is the Back Pain (low and mid)  Pain Assessment: Location: Lower Back Radiating:   Onset: More than a month ago Duration: Chronic pain Quality: Aching, Constant, Throbbing Severity: 6 /10 (subjective, self-reported pain score)  Effect on ADL:   Timing: Constant Modifying factors: heat, topicals, BP: 108/70  HR: 97  [name] comes in today for a follow-up visit after her initial evaluation on 04/19/2020.   Urine toxicology screen and psych assessment are appropriate.  We will have patient sign pain contract today and have her start oxycodone 5 mg daily as needed for pain management.  Avoid acetaminophen given liver dysfunction.  Avoid NSAIDs in the context of end-stage renal disease.  Controlled Substance Pharmacotherapy Assessment REMS (Risk Evaluation and Mitigation Strategy)  Analgesic: Oxycodone 5 mg daily as needed, quantity 30/month; MME equals 10  Pill Count: None expected due to no prior prescriptions written by our practice.  Monitoring: [place] PMP: PDMP reviewed during this encounter. Online review of the past 62-monthperiod previously conducted. Not applicable at this point since we have not taken over the patient's medication management yet. List of other Serum/Urine Drug  Screening Test(s):  Lab Results  Component Value Date   AMPHSCRSER Negative 04/19/2020   BARBSCRSER Negative 04/19/2020   BENZOSCRSER Negative 04/19/2020   COCAINSCRSER Negative 04/19/2020   PCPSCRSER Negative 04/19/2020   THCSCRSER Negative 04/19/2020   OPIATESCRSER Negative 04/19/2020  OXYSCRSER Negative 04/19/2020   PROPOXSCRSER Negative 04/19/2020   List of all UDS test(s) done:  No results found for: TOXASSSELUR, SUMMARY Last UDS on record: No results found for: TOXASSSELUR, SUMMARY UDS interpretation: No unexpected findings.          Medication Assessment Form: Patient introduced to form today Treatment compliance: Treatment may start today if patient agrees with proposed plan. Evaluation of compliance is not applicable at this point Risk Assessment Profile: Aberrant behavior: See initial evaluations. None observed or detected today Comorbid factors increasing risk of overdose: See initial evaluation. No additional risks detected today Opioid risk tool (ORT):  No flowsheet data found.  ORT Scoring interpretation table:  Score <3 = Low Risk for SUD  Score between 4-7 = Moderate Risk for SUD  Score >8 = High Risk for Opioid Abuse   Risk of substance use disorder (SUD): Low  Risk Mitigation Strategies:  Patient opioid safety counseling: Completed today. Counseling provided to patient as per "Patient Counseling Document". Document signed by patient, attesting to counseling and understanding Patient-Prescriber Agreement (PPA): Obtained today.  Controlled substance notification to other providers: Written and sent today.  Pharmacologic Plan: Today we may be taking over the patient's pharmacological regimen. See below.             Laboratory Chemistry Profile   Renal Lab Results  Component Value Date   BUN 13 05/04/2012   CREATININE 0.62 05/04/2012   GFRAA >60 05/04/2012   GFRNONAA >60 05/04/2012     Electrolytes Lab Results  Component Value Date   NA 137  05/04/2012   K 4.6 05/04/2012   CL 102 05/04/2012   CALCIUM 9.0 05/04/2012     Hepatic Lab Results  Component Value Date   AST 22 05/07/2013   ALT 18 05/07/2013   ALBUMIN 4.6 05/07/2013   ALKPHOS 76 05/07/2013     ID No results found for: LYMEIGGIGMAB, HIV, [place], STAPHAUREUS, MRSAPCR, HCVAB, PREGTESTUR, RMSFIGG, QFVRPH1IGG, QFVRPH2IGG, LYMEIGGIGMAB   Bone No results found for: VD25OH, IN867EH2CNO, BS9628ZM6, QH4765YY5, 25OHVITD1, 25OHVITD2, 25OHVITD3, TESTOFREE, TESTOSTERONE   Endocrine Lab Results  Component Value Date   GLUCOSE 100 (H) 05/04/2012     Neuropathy No results found for: VITAMINB12, FOLATE, HGBA1C, HIV   CNS No results found for: COLORCSF, APPEARCSF, RBCCOUNTCSF, WBCCSF, POLYSCSF, LYMPHSCSF, EOSCSF, PROTEINCSF, GLUCCSF, JCVIRUS, CSFOLI, IGGCSF, LABACHR, ACETBL, LABACHR, ACETBL   Inflammation (CRP: Acute  ESR: Chronic) No results found for: CRP, ESRSEDRATE, LATICACIDVEN   Rheumatology Lab Results  Component Value Date   LABURIC 12.0 (H) 06/29/2019     Coagulation Lab Results  Component Value Date   PLT 262 06/29/2019     Cardiovascular Lab Results  Component Value Date   TROPONINI < 0.02 05/04/2012   HGB 13.0 06/29/2019   HCT 37.3 06/29/2019     Screening No results found for: SARSCOV2NAA, COVIDSOURCE, STAPHAUREUS, MRSAPCR, HCVAB, HIV, PREGTESTUR   Cancer No results found for: CEA, CA125, LABCA2   Allergens No results found for: ALMOND, APPLE, ASPARAGUS, AVOCADO, BANANA, BARLEY, BASIL, BAYLEAF, GREENBEAN, LIMABEAN, WHITEBEAN, BEEFIGE, REDBEET, BLUEBERRY, BROCCOLI, CABBAGE, MELON, CARROT, CASEIN, CASHEWNUT, CAULIFLOWER, CELERY     Note: Lab results reviewed.  Recent Diagnostic Imaging Review   DG Thoracic Spine 2 View  Narrative CLINICAL DATA:  Midthoracic back pain following a fall at home 8 days ago.  EXAM: THORACIC SPINE 2 VIEWS  COMPARISON:  Chest radiographs dated 12/14/2017. Chest and bilateral rib radiographs obtained  today.  FINDINGS: Approximately 20% T10 superior endplate compression deformity  with mild sclerosis and no visible fracture lines. No bony retropulsion seen. Stable mild scoliosis. No subluxations.  IMPRESSION: Approximately 20% T10 superior endplate compression fracture with mild sclerosis and no visible fracture lines, new since 12/14/2017.   Electronically Signed By: Claudie Revering M.D. On: 09/05/2019 14:05  DG Lumbar Spine Complete  Narrative CLINICAL DATA:  Fall, back/rib pain  EXAM: LUMBAR SPINE - COMPLETE 4+ VIEW  COMPARISON:  None.  FINDINGS: Five lumbar-type vertebral bodies.  Normal lumbar lordosis.  No evidence of fracture or dislocation. Vertebral body heights and intervertebral disc spaces are maintained.  Mild degenerative changes of the lower lumbar spine.  Visualized bony pelvis appears intact.  Vascular calcifications.  IMPRESSION: Negative.   Electronically Signed By: Julian Hy M.D. On: 09/05/2019 13:57   DG Knee Complete 4 Views Right  Narrative CLINICAL DATA:  RIGHT leg trauma.  EXAM: RIGHT KNEE - COMPLETE 4+ VIEW  COMPARISON:  None.  FINDINGS: No evidence of fracture, dislocation, or joint effusion. No evidence of arthropathy or other focal bone abnormality. Soft tissues are unremarkable.  IMPRESSION: Negative.   Electronically Signed By: Franki Cabot M.D. On: 02/21/2019 16:53  DG Ankle Complete Right  Narrative CLINICAL DATA:  Lateral ankle pain 3 days.  EXAM: RIGHT ANKLE - COMPLETE 3+ VIEW  COMPARISON:  02/21/2019  FINDINGS: There is no evidence of fracture, dislocation, or joint effusion. There is no evidence of arthropathy or other focal bone abnormality. Soft tissues are unremarkable.  IMPRESSION: Negative.   Electronically Signed By: Franchot Gallo M.D. On: 06/29/2019 15:45  Foot Imaging: Foot-R DG Complete: Results for orders placed during the hospital encounter of 06/29/19  DG Foot  Complete Right  Narrative CLINICAL DATA:  Pain and swelling.  Redness.  EXAM: RIGHT FOOT COMPLETE - 3+ VIEW  COMPARISON:  02/21/2019.  FINDINGS: No acute bony or joint abnormality. No evidence of fracture or dislocation. No radiopaque foreign body.  IMPRESSION: No acute abnormality.   Electronically Signed By: Marcello Moores  Register On: 06/29/2019 15:46   Complexity Note: Imaging results reviewed. Results shared with Ms. Depner, using Layman's terms.                         Meds   Current Outpatient Medications:  .  acetaminophen (TYLENOL) 500 MG tablet, Take by mouth., Disp: , Rfl:  .  apixaban (ELIQUIS) 2.5 MG TABS tablet, TAKE 1 TABLET (2.5 MG TOTAL) BY MOUTH TWO (2) TIMES A DAY., Disp: , Rfl:  .  diclofenac Sodium (VOLTAREN) 1 % GEL, Apply topically., Disp: , Rfl:  .  Fluticasone-Salmeterol (ADVAIR DISKUS) 250-50 MCG/DOSE AEPB, Inhale into the lungs., Disp: , Rfl:  .  furosemide (LASIX) 80 MG tablet, Take by mouth., Disp: , Rfl:  .  gabapentin (NEURONTIN) 300 MG capsule, Take 1 capsule (300 mg total) by mouth 2 (two) times daily., Disp: 20 capsule, Rfl: 0 .  ipratropium (ATROVENT HFA) 17 MCG/ACT inhaler, Inhale into the lungs., Disp: , Rfl:  .  ipratropium-albuterol (DUONEB) 0.5-2.5 (3) MG/3ML SOLN, Inhale into the lungs., Disp: , Rfl:  .  lovastatin (MEVACOR) 20 MG tablet, Take 1 tablet (20 mg total) by mouth at bedtime., Disp: 90 tablet, Rfl: 3 .  lovastatin (MEVACOR) 40 MG tablet, , Disp: , Rfl:  .  melatonin 3 MG TABS tablet, Take by mouth., Disp: , Rfl:  .  metoprolol succinate (TOPROL-XL) 100 MG 24 hr tablet, Take by mouth., Disp: , Rfl:  .  metoprolol tartrate (  LOPRESSOR) 100 MG tablet, Take 50 mg by mouth. , Disp: , Rfl:  .  Multiple Vitamin (MULTIVITAMIN) tablet, Take 1 tablet by mouth daily., Disp: , Rfl:  .  nitroGLYCERIN (NITROSTAT) 0.4 MG SL tablet, Place 1 tablet (0.4 mg total) under the tongue every 5 (five) minutes as needed for chest pain., Disp: 25 tablet, Rfl:  6 .  Prenatal Vit-Fe Fumarate-FA (NIVA-PLUS) 27-1 MG TABS, Take 1 tablet by mouth daily., Disp: , Rfl:  .  sennosides-docusate sodium (SENOKOT-S) 8.6-50 MG tablet, Take 1 tablet by mouth 2 (two) times daily., Disp: , Rfl:  .  thiamine 100 MG tablet, Take by mouth., Disp: , Rfl:  .  tiotropium (SPIRIVA HANDIHALER) 18 MCG inhalation capsule, Place into inhaler and inhale., Disp: , Rfl:  .  furosemide (LASIX) 20 MG tablet, Take by mouth., Disp: , Rfl:  .  oxyCODONE (OXY IR/ROXICODONE) 5 MG immediate release tablet, Take 1 tablet (5 mg total) by mouth daily as needed for severe pain. Must last 30 days., Disp: 30 tablet, Rfl: 0 .  [START ON 11/11/2020] oxyCODONE (OXY IR/ROXICODONE) 5 MG immediate release tablet, Take 1 tablet (5 mg total) by mouth daily as needed for severe pain. Must last 30 days., Disp: 30 tablet, Rfl: 0 .  rivaroxaban (XARELTO) 20 MG TABS tablet, Take by mouth., Disp: , Rfl:  .  valACYclovir (VALTREX) 1000 MG tablet, Take 1 tablet (1,000 mg total) by mouth 3 (three) times daily. (Patient not taking: Reported on 10/12/2020), Disp: 21 tablet, Rfl: 0  ROS  Constitutional: Denies any fever or chills Gastrointestinal: No reported hemesis, hematochezia, vomiting, or acute GI distress Musculoskeletal: Denies any acute onset joint swelling, redness, loss of ROM, or weakness Neurological: No reported episodes of acute onset apraxia, aphasia, dysarthria, agnosia, amnesia, paralysis, loss of coordination, or loss of consciousness  Allergies  Ms. Childers is allergic to codeine.  PFSH  Drug: Ms. Pinkstaff  reports no history of drug use. Alcohol:  reports current alcohol use. Tobacco:  reports that she has quit smoking. Her smoking use included cigarettes. She has a 40.00 pack-year smoking history. She has never used smokeless tobacco. Medical:  has a past medical history of Allergy, Arthritis, CHF (congestive heart failure) ([place]), Chronic bronchitis ([place]), Chronic kidney disease, Collapse of lung,  COPD (chronic obstructive pulmonary disease) ([place]), History of DVT (deep vein thrombosis) (1976), Hypertension, and On home O2. Surgical: Ms. Vanegas  has a past surgical history that includes Tubal ligation. Family: family history includes Alcohol abuse in her father; Heart attack in her brother and father.  Constitutional Exam  General appearance: Well nourished, well developed, and well hydrated. In no apparent acute distress Vitals:   10/12/20 1347  BP: 108/70  Pulse: 97  Resp: 18  Temp: 98.3 F (36.8 C)  TempSrc: Oral  SpO2: 100%  Weight: 167 lb (75.8 kg)  Height: _0  (1.626 m)   BMI Assessment: Estimated body mass index is 28.67 kg/m as calculated from the following:   Height as of this encounter: _1  (1.626 m).   Weight as of this encounter: 167 lb (75.8 kg).  BMI interpretation table: BMI level Category Range association with higher incidence of chronic pain  <18 kg/m2 Underweight   18.5-24.9 kg/m2 Ideal body weight   25-29.9 kg/m2 Overweight Increased incidence by 20%  30-34.9 kg/m2 Obese (Class I) Increased incidence by 68%  35-39.9 kg/m2 Severe obesity (Class II) Increased incidence by 136%  >40 kg/m2 Extreme obesity (Class III) Increased incidence by 254%  Patient's current BMI Ideal Body weight  Body mass index is 28.67 kg/m. Ideal body weight: 54.7 kg (120 lb 9.5 oz) Adjusted ideal body weight: 63.1 kg (139 lb 2.5 oz)   BMI Readings from Last 4 Encounters:  10/12/20 28.67 kg/m  09/04/20 29.70 kg/m  04/19/20 27.98 kg/m  09/05/19 32.61 kg/m   Wt Readings from Last 4 Encounters:  10/12/20 167 lb (75.8 kg)  09/04/20 173 lb (78.5 kg)  04/19/20 153 lb (69.4 kg)  09/05/19 190 lb (86.2 kg)    Psych/Mental status: Alert, oriented x 3 (person, place, & time)       Eyes: PERLA Respiratory: No evidence of acute respiratory distress Cervical Spine Exam  Skin & Axial Inspection: No masses, redness, edema, swelling, or associated skin lesions Alignment:  Symmetrical Functional ROM: Unrestricted ROM      Stability: No instability detected Muscle Tone/Strength: Functionally intact. No obvious neuro-muscular anomalies detected. Sensory (Neurological): Unimpaired Palpation: No palpable anomalies                    Upper Extremity (UE) Exam    Side: Right upper extremity  Side: Left upper extremity   Skin & Extremity Inspection: Skin color, temperature, and hair growth are WNL. No peripheral edema or cyanosis. No masses, redness, swelling, asymmetry, or associated skin lesions. No contractures.  Skin & Extremity Inspection: Skin color, temperature, and hair growth are WNL. No peripheral edema or cyanosis. No masses, redness, swelling, asymmetry, or associated skin lesions. No contractures.   Functional ROM: Unrestricted ROM          Functional ROM: Unrestricted ROM           Muscle Tone/Strength: Functionally intact. No obvious neuro-muscular anomalies detected.  Muscle Tone/Strength: Functionally intact. No obvious neuro-muscular anomalies detected.   Sensory (Neurological): Unimpaired          Sensory (Neurological): Unimpaired           Palpation: No palpable anomalies              Palpation: No palpable anomalies               Provocative Test(s):  Phalen's test: deferred Tinel's test: deferred Apley's scratch test (touch opposite shoulder):  Action 1 (Across chest): deferred Action 2 (Overhead): deferred Action 3 (LB reach): deferred   Provocative Test(s):  Phalen's test: deferred Tinel's test: deferred Apley's scratch test (touch opposite shoulder):  Action 1 (Across chest): deferred Action 2 (Overhead): deferred Action 3 (LB reach): deferred     Thoracic Spine Area Exam  Skin & Axial Inspection: No masses, redness, or swelling Alignment: Symmetrical Functional ROM: Unrestricted ROM Stability: No instability detected Muscle Tone/Strength: Functionally intact. No obvious neuro-muscular anomalies detected. Sensory  (Neurological): Unimpaired Muscle strength & Tone: No palpable anomalies  Lumbar Exam  Skin & Axial Inspection: No masses, redness, or swelling Alignment: Symmetrical Functional ROM: Unrestricted ROM       Stability: No instability detected Muscle Tone/Strength: Functionally intact. No obvious neuro-muscular anomalies detected. Sensory (Neurological): Unimpaired Palpation: No palpable anomalies       Provocative Tests: Hyperextension/rotation test: deferred today       Lumbar quadrant test (Kemp's test): deferred today       Lateral bending test: deferred today       Patrick's Maneuver: deferred today                   FABER* test: deferred today  S-I anterior distraction/compression test: deferred today         S-I lateral compression test: deferred today         S-I Thigh-thrust test: deferred today         S-I Gaenslen's test: deferred today         *(Flexion, ABduction and External Rotation)  Gait & Posture Assessment  Ambulation: Unassisted Gait: Relatively normal for age and body habitus Posture: WNL   Lower Extremity Exam    Side: Right lower extremity  Side: Left lower extremity  Stability: No instability observed          Stability: No instability observed          Skin & Extremity Inspection: Skin color, temperature, and hair growth are WNL. No peripheral edema or cyanosis. No masses, redness, swelling, asymmetry, or associated skin lesions. No contractures.  Skin & Extremity Inspection: Skin color, temperature, and hair growth are WNL. No peripheral edema or cyanosis. No masses, redness, swelling, asymmetry, or associated skin lesions. No contractures.  Functional ROM: Unrestricted ROM                  Functional ROM: Unrestricted ROM                  Muscle Tone/Strength: Functionally intact. No obvious neuro-muscular anomalies detected.  Muscle Tone/Strength: Functionally intact. No obvious neuro-muscular anomalies detected.  Sensory  (Neurological): Unimpaired        Sensory (Neurological): Unimpaired        DTR: Patellar: deferred today Achilles: deferred today Plantar: deferred today  DTR: Patellar: deferred today Achilles: deferred today Plantar: deferred today  Palpation: No palpable anomalies  Palpation: No palpable anomalies     Assessment & Plan  Primary Diagnosis & Pertinent Problem List: The primary encounter diagnosis was Lumbar spondylosis. Diagnoses of Lumbar degenerative disc disease, Chronic thoracic spine pain, Compression fracture of thoracic vertebra, unspecified thoracic vertebral level, sequela, Chronic pain syndrome, and Pain management contract signed were also pertinent to this visit.  Visit Diagnosis: 1. Lumbar spondylosis   2. Lumbar degenerative disc disease   3. Chronic thoracic spine pain   4. Compression fracture of thoracic vertebra, unspecified thoracic vertebral level, sequela   5. Chronic pain syndrome   6. Pain management contract signed    Problems updated and reviewed during this visit: Problem  Pain Management Contract Signed    Plan of Care  Pharmacotherapy (Medications Ordered): Meds ordered this encounter  Medications  . oxyCODONE (OXY IR/ROXICODONE) 5 MG immediate release tablet    Sig: Take 1 tablet (5 mg total) by mouth daily as needed for severe pain. Must last 30 days.    Dispense:  30 tablet    Refill:  0    Chronic Pain. (STOP Act - Not applicable). Fill one day early if closed on scheduled refill date.  Marland Kitchen oxyCODONE (OXY IR/ROXICODONE) 5 MG immediate release tablet    Sig: Take 1 tablet (5 mg total) by mouth daily as needed for severe pain. Must last 30 days.    Dispense:  30 tablet    Refill:  0    Chronic Pain. (STOP Act - Not applicable). Fill one day early if closed on scheduled refill date.    Pharmacological management options:  Opioid Analgesics: We'll take over management today. See above orders Membrane stabilizer: Currently on a membrane  stabilizer gabapentin 300 mg twice a day Muscle relaxant: Not indicated NSAID: Medically contraindicated end-stage renal disease and on Eliquis.  Other analgesic(s): To be determined at a later time     Provider-requested follow-up: Return in about 8 weeks (around 12/07/2020) for Medication Management, in person. Recent Visits No visits were found meeting these conditions. Showing recent visits within past 90 days and meeting all other requirements Today's Visits Date Type Provider Dept  10/12/20 Office Visit Gillis Santa, MD [facility]  Showing today's visits and meeting all other requirements Future Appointments Date Type Provider Dept  12/05/20 Appointment Gillis Santa, MD [facility]  Showing future appointments within next 90 days and meeting all other requirements  Primary Care Physician: [facility] Note by: Gillis Santa, MD Date: 10/12/2020; Time: 3:00 PM

## 2020-12-05 ENCOUNTER — Ambulatory Visit
Payer: Medicare Other | Attending: Student in an Organized Health Care Education/Training Program | Admitting: Student in an Organized Health Care Education/Training Program

## 2020-12-05 ENCOUNTER — Telehealth: Payer: Self-pay | Admitting: *Deleted

## 2020-12-05 ENCOUNTER — Encounter: Payer: Self-pay | Admitting: Student in an Organized Health Care Education/Training Program

## 2020-12-05 ENCOUNTER — Other Ambulatory Visit: Payer: Self-pay

## 2020-12-05 DIAGNOSIS — M546 Pain in thoracic spine: Secondary | ICD-10-CM | POA: Diagnosis not present

## 2020-12-05 DIAGNOSIS — S22000S Wedge compression fracture of unspecified thoracic vertebra, sequela: Secondary | ICD-10-CM

## 2020-12-05 DIAGNOSIS — M47816 Spondylosis without myelopathy or radiculopathy, lumbar region: Secondary | ICD-10-CM

## 2020-12-05 DIAGNOSIS — G8929 Other chronic pain: Secondary | ICD-10-CM

## 2020-12-05 DIAGNOSIS — M5136 Other intervertebral disc degeneration, lumbar region: Secondary | ICD-10-CM

## 2020-12-05 DIAGNOSIS — G894 Chronic pain syndrome: Secondary | ICD-10-CM

## 2020-12-05 NOTE — Telephone Encounter (Signed)
Patient notified there is a script for Oxycodone available to be filled.

## 2020-12-05 NOTE — Progress Notes (Signed)
Patient: Kellie Morris  Service Category: E/M  Provider: Gillis Santa, MD  DOB: 1956-08-16  DOS: 12/05/2020  Location: Office  MRN: [id number]  Setting: Ambulatory outpatient  Referring Provider: [facility]  Type: Established Patient  Specialty: Interventional Pain Management  PCP: [facility]  Location: Home  Delivery: TeleHealth     Virtual Encounter - Pain Management PROVIDER NOTE: Information contained herein reflects review and annotations entered in association with encounter. Interpretation of such information and data should be left to medically-trained personnel. Information provided to patient can be located elsewhere in the medical record under "Patient Instructions". Document created using STT-dictation technology, any transcriptional errors that may result from process are unintentional.    Contact & Pharmacy Preferred: [phone] Home: [phone] (home) Mobile: [phone] (mobile) E-mail: [email or web address]  [id number] [place] [address]Phone: [phone]: [phone]  Pre-screening  Ms. Kellie Haywardoffered "in-person" vs "virtual" encounter. She indicated preferring virtual for this encounter.   Reason COVID-19*  Social distancing based on CDC and AMA recommendations.   I contacted DAzjah Pardoon 12/05/2020 via telephone.      I clearly identified myself as BGillis Santa MD. I verified that I was speaking with the correct person using two identifiers (Name: Kellie Morris and date of birth: [birth year].  Consent I sought verbal advanced consent from Kellie Richardsfor virtual visit interactions. I informed Ms. Kellie Haywardof possible security and privacy concerns, risks, and limitations associated with providing "not-in-person" medical evaluation and management services. I also informed Ms. Kellie Haywardof the availability of "in-person" appointments. Finally, I informed her that there would be a charge for the  virtual visit and that she could be  personally, fully or partially, financially responsible for it. Ms. LMilichexpressed understanding and agreed to proceed.   Historic Elements   Ms. Kellie Sandrais a 65y.o. year old, female patient evaluated today after our last contact on 10/12/2020. [name] has a past medical history of Allergy, Arthritis, CHF (congestive heart failure) ([place], [place] ([place], Chronic kidney disease, Collapse of lung, COPD (ch[place] obstructive [place] disease) [place], History of DVT (deep vein thrombosis) (1976), Hypertension, and On home O2. She also  has a past surgical history that includes Tubal ligation. Ms. LAlcarazhas a current medication list which includes the following prescription(s): acetaminophen, apixaban, diclofenac sodium, fluticasone-salmeterol, furosemide, furosemide, gabapentin, ipratropium, ipratropium-albuterol, lovastatin, lovastatin, melatonin, metoprolol succinate, metoprolol tartrate, multivitamin, nitroglycerin, oxycodone, niva-plus, rivaroxaban, sennosides-docusate sodium, thiamine, spiriva handihaler, valacyclovir, [DISCONTINUED] lisinopril, and [DISCONTINUED] omeprazole. She  reports that she has quit smoking. Her smoking use included cigarettes. She has a 40.00 pack-year smoking history. She has never used smokeless tobacco. She reports current alcohol use. She reports that she does not use drugs. Ms. LDegraveis allergic to codeine.   HPI  Today, she is being contacted for medication management.  No change in medical history since last visit.  Patient's pain is at baseline.  Patient continues multimodal pain regimen as prescribed.  States that it provides pain relief and improvement in functional status.  Patient was confused and thought that she did not have another refill at her pharmacy.  We called her pharmacy and confirmed that she still has 1 refill of oxycodone that she can pick up.  Patient was notified.  Instructed to follow-up as needed when she  is due for a refill as she takes her oxycodone sporadically.   10/12/2020  10/12/2020  1  Oxycodone Hcl (Ir) 5 Mg Tablet  30.00  30  Bi Lat  [id number]  [place] ([id number])  0/0  7.50 MME  Private Pay  [place]      Pharmacotherapy Assessment  Analgesic: Oxycodone 5 mg daily as needed, quantity 30/month; MME equals 10   Monitoring: [place] PMP: PDMP reviewed during this encounter.       Pharmacotherapy: No side-effects or adverse reactions reported. Compliance: No problems identified. Effectiveness: Clinically acceptable. Plan: Refer to "POC".  UDS: No results found for: SUMMARY  Laboratory Chemistry Profile   Renal Lab Results  Component Value Date   BUN 13 05/04/2012   CREATININE 0.62 05/04/2012   GFRAA >60 05/04/2012   GFRNONAA >60 05/04/2012     Hepatic Lab Results  Component Value Date   AST 22 05/07/2013   ALT 18 05/07/2013   ALBUMIN 4.6 05/07/2013   ALKPHOS 76 05/07/2013     Electrolytes Lab Results  Component Value Date   NA 137 05/04/2012   K 4.6 05/04/2012   CL 102 05/04/2012   CALCIUM 9.0 05/04/2012     Bone No results found for: VD25OH, VD125OH2TOT, [id number], [id number], 25OHVITD1, 25OHVITD2, 25OHVITD3, TESTOFREE, TESTOSTERONE   Inflammation (CRP: Acute Phase) (ESR: Chronic Phase) No results found for: CRP, ESRSEDRATE, LATICACIDVEN     Note: Above Lab results reviewed.  Imaging  DG Ribs Unilateral Right CLINICAL DATA:  Thoracic spine and bilateral rib pain following a fall at home 8 days ago.  EXAM: RIGHT RIBS - 2 VIEW  COMPARISON:  Chest and left rib radiographs obtained today and chest radiographs dated 12/14/2017.  FINDINGS: Multiple old, healed right rib fractures. No acute fracture or pneumothorax seen. Diffuse osteopenia.  IMPRESSION: No acute abnormality. Old, healed right rib fractures.  Electronically Signed   By: Claudie Revering M.D.   On: 09/05/2019 14:06 DG Thoracic Spine 2 View CLINICAL DATA:  Midthoracic back pain following a fall at home  8 days ago.  EXAM: THORACIC SPINE 2 VIEWS  COMPARISON:  Chest radiographs dated 12/14/2017. Chest and bilateral rib radiographs obtained today.  FINDINGS: Approximately 20% T10 superior endplate compression deformity with mild sclerosis and no visible fracture lines. No bony retropulsion seen. Stable mild scoliosis. No subluxations.  IMPRESSION: Approximately 20% T10 superior endplate compression fracture with mild sclerosis and no visible fracture lines, new since 12/14/2017.  Electronically Signed   By: Claudie Revering M.D.   On: 09/05/2019 14:05 DG Ribs Unilateral W/Chest Left CLINICAL DATA:  Severe bilateral mid rib pain, greater on the left, following a fall. The patient also has thoracic spine pain at approximately the T5 level. Ex-smoker.  EXAM: LEFT RIBS AND CHEST - 3+ VIEW  COMPARISON:  Chest radiographs dated 12/14/2017  FINDINGS: Mildly progressive enlargement of the cardiac silhouette with stable mild prominence of the pulmonary vasculature. Slightly decreased depth of inspiration. Interval small amount of linear atelectasis or scarring in the left mid lung zone. Old, healed left 6th rib fracture. No acute fracture and no pneumothorax.  IMPRESSION: 1. No acute abnormality. 2. Mildly progressive cardiomegaly with stable mild pulmonary vascular congestion. 3. Interval small amount of linear atelectasis or scarring in the left mid lung zone.  Electronically Signed   By: Claudie Revering M.D.   On: 09/05/2019 14:00 DG Lumbar Spine Complete CLINICAL DATA:  Fall, back/rib pain  EXAM: LUMBAR SPINE - COMPLETE 4+ VIEW  COMPARISON:  None.  FINDINGS: Five lumbar-type vertebral bodies.  Normal lumbar lordosis.  No evidence of fracture or dislocation. Vertebral  body heights and intervertebral disc spaces are maintained.  Mild degenerative changes of the lower lumbar spine.  Visualized bony pelvis appears intact.  Vascular  calcifications.  IMPRESSION: Negative.  Electronically Signed   By: Julian Hy M.D.   On: 09/05/2019 13:57  Assessment  The primary encounter diagnosis was Lumbar spondylosis. Diagnoses of Lumbar degenerative disc disease, Chronic thoracic spine pain, Compression fracture of thoracic vertebra, unspecified thoracic vertebral level, sequela, and Chronic pain syndrome were also pertinent to this visit.  Plan of Care  Ms. Prerna Harold has a current medication list which includes the following long-term medication(s): fluticasone-salmeterol, furosemide, furosemide, gabapentin, ipratropium, lovastatin, lovastatin, metoprolol succinate, metoprolol tartrate, nitroglycerin, rivaroxaban, spiriva handihaler, [DISCONTINUED] lisinopril, and [DISCONTINUED] omeprazole.  Continue oxycodone and gabapentin as prescribed for chronic pain.  Instructed patient to refrain from NSAIDs as she is already on Eliquis.  Continue acetaminophen as needed.  Follow-up as needed for medication refill.  Follow-up plan:   No follow-ups on file.   Recent Visits Date Type Provider Dept  10/12/20 Office Visit Gillis Santa, MD [facility]  Showing recent visits within past 90 days and meeting all other requirements Today's Visits Date Type Provider Dept  12/05/20 Telemedicine Gillis Santa, MD [facility]  Showing today's visits and meeting all other requirements Future Appointments No visits were found meeting these conditions. Showing future appointments within next 90 days and meeting all other requirements  I discussed the assessment and treatment plan with the patient. The patient was provided an opportunity to ask questions and all were answered. The patient agreed with the plan and demonstrated an understanding of the instructions.  Patient advised to call back or seek an in-person evaluation if the symptoms or condition worsens.  Duration of encounter: 51mnutes.  Note by: BGillis Santa MD Date: 12/05/2020; Time: 12:38 PM

## 2021-01-09 ENCOUNTER — Telehealth: Payer: Self-pay

## 2021-01-09 NOTE — Telephone Encounter (Signed)
Please advise 

## 2021-01-09 NOTE — Telephone Encounter (Signed)
Please confirm that she does not have any prescriptions waiting to be filled at her pharmacy.  If she does not she will need a face-to-face visit for med refill.  Okay to double book her.

## 2021-01-09 NOTE — Telephone Encounter (Signed)
Dr. Holley Raring,                Your last note says she is to call for an appointment if she needs a refill of her oxycodone since she takes it sporadically. She was just here the first part of January. Does she need to have an appointment? Please advise.

## 2021-01-09 NOTE — Telephone Encounter (Signed)
Pt is stating that the pharmacist is stating that she doesn't have her med refill.

## 2021-01-10 NOTE — Telephone Encounter (Signed)
Pharmacy called. No outstanding scripts found. Called patient and LVM for her to call and make an appointment.

## 2021-01-11 ENCOUNTER — Ambulatory Visit
Payer: Medicare Other | Attending: Student in an Organized Health Care Education/Training Program | Admitting: Student in an Organized Health Care Education/Training Program

## 2021-01-11 ENCOUNTER — Other Ambulatory Visit: Payer: Self-pay

## 2021-01-11 ENCOUNTER — Encounter: Payer: Self-pay | Admitting: Student in an Organized Health Care Education/Training Program

## 2021-01-11 VITALS — BP 106/50 | HR 92 | Temp 98.2°F | Resp 18 | Ht 64.0 in | Wt 150.0 lb

## 2021-01-11 DIAGNOSIS — M5136 Other intervertebral disc degeneration, lumbar region: Secondary | ICD-10-CM | POA: Insufficient documentation

## 2021-01-11 DIAGNOSIS — G894 Chronic pain syndrome: Secondary | ICD-10-CM | POA: Insufficient documentation

## 2021-01-11 DIAGNOSIS — M47816 Spondylosis without myelopathy or radiculopathy, lumbar region: Secondary | ICD-10-CM | POA: Insufficient documentation

## 2021-01-11 DIAGNOSIS — G8929 Other chronic pain: Secondary | ICD-10-CM | POA: Insufficient documentation

## 2021-01-11 DIAGNOSIS — S22000S Wedge compression fracture of unspecified thoracic vertebra, sequela: Secondary | ICD-10-CM | POA: Diagnosis not present

## 2021-01-11 DIAGNOSIS — M546 Pain in thoracic spine: Secondary | ICD-10-CM | POA: Diagnosis present

## 2021-01-11 DIAGNOSIS — Z0289 Encounter for other administrative examinations: Secondary | ICD-10-CM | POA: Insufficient documentation

## 2021-01-11 MED ORDER — OXYCODONE HCL 5 MG PO TABS
5.0000 mg | ORAL_TABLET | Freq: Every day | ORAL | 0 refills | Status: AC | PRN
Start: 2021-01-11 — End: 2021-02-10

## 2021-01-11 MED ORDER — OXYCODONE HCL 5 MG PO TABS
5.0000 mg | ORAL_TABLET | Freq: Every day | ORAL | 0 refills | Status: DC | PRN
Start: 2021-02-10 — End: 2021-03-08

## 2021-01-11 NOTE — Progress Notes (Signed)
Nursing Pain Medication Assessment:  Safety precautions to be maintained throughout the outpatient stay will include: orient to surroundings, keep bed in low position, maintain call bell within reach at all times, provide assistance with transfer out of bed and ambulation.  Medication Inspection Compliance: Pill count conducted under aseptic conditions, in front of the patient. Neither the pills nor the bottle was removed from the patient's sight at any time. Once count was completed pills were immediately returned to the patient in their original bottle.  Medication: Oxycodone IR Pill/Patch Count: 0 of 30 pills remain Pill/Patch Appearance: Markings consistent with prescribed medication Bottle Appearance: Standard pharmacy container. Clearly labeled. Filled Date: 01 / 03 / 2022 Last Medication intake:  Ran out of medicine more than 48 hours ago

## 2021-01-11 NOTE — Progress Notes (Signed)
PROVIDER NOTE: Information contained herein reflects review and annotations entered in association with encounter. Interpretation of such information and data should be left to medically-trained personnel. Information provided to patient can be located elsewhere in the medical record under "Patient Instructions". Document created using STT-dictation technology, any transcriptional errors that may result from process are unintentional.    Patient: Kellie Morris  Service Category: E/M  Provider: Gillis Santa, MD  DOB: 06-18-1956  DOS: 01/11/2021  Specialty: Interventional Pain Management  MRN: [id number]  Setting: Ambulatory outpatient  PCP: [facility]  Type: Established Patient    Referring Provider: [facility]  Location: Office  Delivery: Face-to-face     HPI  [name], a 65 y.o. year old female, is here today because of her Lumbar spondylosis [M47.816]. [name] primary complain today is Back Pain (Mid and low) Last encounter: My last encounter with her was on 10/12/2020. Pertinent problems: [name] does not have any pertinent problems on file. Pain Assessment: Severity of Chronic pain is reported as a 6 /10. Location: Back Lower/bilateral hips. Onset: More than a month ago. Quality: Aching,Constant,Sharp. Timing: Constant. Modifying factor(s):  Marland Kitchen Vitals:  height is 5' 4"  (1.626 m) and weight is 150 lb (68 kg). Her oral temperature is 98.2 F (36.8 C). Her blood pressure is 106/50 (abnormal) and her pulse is 92. Her respiration is 18 and oxygen saturation is 96%.   Reason for encounter: medication management.    Patient follows up today for medication management.  Unfortunately she was in the hospital for shortness of breath.  She states that she tries to utilize her oxycodone 1 to 2 tablets on dialysis days also as needed for breakthrough pain on her nondialysis days. Patient with iron deficiency anemia and received blood transfusion last week which she states helped  with her symptoms Otherwise I will refill her medications and she will follow-up 8 weeks for medication management  Pharmacotherapy Assessment   Analgesic: Oxycodone 5 mg daily as needed, quantity 30/month; MME equals 10   Monitoring:  PMP: PDMP reviewed during this encounter.       Pharmacotherapy: No side-effects or adverse reactions reported. Compliance: No problems identified. Effectiveness: Clinically acceptable.  Hart Rochester, RN  01/11/2021  2:17 PM  Sign when Signing Visit Nursing Pain Medication Assessment:  Safety precautions to be maintained throughout the outpatient stay will include: orient to surroundings, keep bed in low position, maintain call bell within reach at all times, provide assistance with transfer out of bed and ambulation.  Medication Inspection Compliance: Pill count conducted under aseptic conditions, in front of the patient. Neither the pills nor the bottle was removed from the patient's sight at any time. Once count was completed pills were immediately returned to the patient in their original bottle.  Medication: Oxycodone IR Pill/Patch Count: 0 of 30 pills remain Pill/Patch Appearance: Markings consistent with prescribed medication Bottle Appearance: Standard pharmacy container. Clearly labeled. Filled Date: 01 / 03 / 2022 Last Medication intake:  Ran out of medicine more than 48 hours ago    UDS: No results found for: SUMMARY   ROS  Constitutional: Denies any fever or chills Gastrointestinal: No reported hemesis, hematochezia, vomiting, or acute GI distress Musculoskeletal: Denies any acute onset joint swelling, redness, loss of ROM, or weakness Neurological: No reported episodes of acute onset apraxia, aphasia, dysarthria, agnosia, amnesia, paralysis, loss of coordination, or loss of consciousness  Medication Review  Fluticasone-Salmeterol, acetaminophen, apixaban, diclofenac Sodium, furosemide, gabapentin, ipratropium,  ipratropium-albuterol,  lisinopril, lovastatin, melatonin, metoprolol succinate, metoprolol tartrate, multivitamin, nitroGLYCERIN, omeprazole, oxyCODONE, rivaroxaban, sennosides-docusate sodium, thiamine, and tiotropium  History Review  Allergy: [name] is allergic to codeine. Drug: [name]  reports no history of drug use. Alcohol:  reports current alcohol use. Tobacco:  reports that she has quit smoking. Her smoking use included cigarettes. She has a 40.00 pack-year smoking history. She has never used smokeless tobacco. Social: [name]  reports that she has quit smoking. Her smoking use included cigarettes. She has a 40.00 pack-year smoking history. She has never used smokeless tobacco. She reports current alcohol use. She reports that she does not use drugs. Medical:  has a past medical history of Allergy, Arthritis, CHF (congestive heart failure) ([place]), Chronic bronchitis ([place]), Chronic kidney disease, Collapse of lung, COPD (chronic obstructive pulmonary disease) ([place]), History of DVT (deep vein thrombosis) (1976), Hypertension, and On home O2. Surgical: [name]  has a past surgical history that includes Tubal ligation. Family: family history includes Alcohol abuse in her father; Heart attack in her brother and father.  Laboratory Chemistry Profile   Renal Lab Results  Component Value Date   BUN 13 05/04/2012   CREATININE 0.62 05/04/2012   GFRAA >60 05/04/2012   GFRNONAA >60 05/04/2012     Hepatic Lab Results  Component Value Date   AST 22 05/07/2013   ALT 18 05/07/2013   ALBUMIN 4.6 05/07/2013   ALKPHOS 76 05/07/2013     Electrolytes Lab Results  Component Value Date   NA 137 05/04/2012   K 4.6 05/04/2012   CL 102 05/04/2012   CALCIUM 9.0 05/04/2012     Bone No results found for: VD25OH, VD125OH2TOT, DJ2426ST4, HD6222LN9, 25OHVITD1, 25OHVITD2, 25OHVITD3, TESTOFREE, TESTOSTERONE   Inflammation (CRP: Acute Phase) (ESR: Chronic Phase) No results found for: CRP, ESRSEDRATE,  LATICACIDVEN     Note: Above Lab results reviewed.  Recent Imaging Review  DG Ribs Unilateral Right CLINICAL DATA:  Thoracic spine and bilateral rib pain following a fall at home 8 days ago.  EXAM: RIGHT RIBS - 2 VIEW  COMPARISON:  Chest and left rib radiographs obtained today and chest radiographs dated 12/14/2017.  FINDINGS: Multiple old, healed right rib fractures. No acute fracture or pneumothorax seen. Diffuse osteopenia.  IMPRESSION: No acute abnormality. Old, healed right rib fractures.  Electronically Signed   By: Claudie Revering M.D.   On: 09/05/2019 14:06 DG Thoracic Spine 2 View CLINICAL DATA:  Midthoracic back pain following a fall at home 8 days ago.  EXAM: THORACIC SPINE 2 VIEWS  COMPARISON:  Chest radiographs dated 12/14/2017. Chest and bilateral rib radiographs obtained today.  FINDINGS: Approximately 20% T10 superior endplate compression deformity with mild sclerosis and no visible fracture lines. No bony retropulsion seen. Stable mild scoliosis. No subluxations.  IMPRESSION: Approximately 20% T10 superior endplate compression fracture with mild sclerosis and no visible fracture lines, new since 12/14/2017.  Electronically Signed   By: Claudie Revering M.D.   On: 09/05/2019 14:05 DG Ribs Unilateral W/Chest Left CLINICAL DATA:  Severe bilateral mid rib pain, greater on the left, following a fall. The patient also has thoracic spine pain at approximately the T5 level. Ex-smoker.  EXAM: LEFT RIBS AND CHEST - 3+ VIEW  COMPARISON:  Chest radiographs dated 12/14/2017  FINDINGS: Mildly progressive enlargement of the cardiac silhouette with stable mild prominence of the pulmonary vasculature. Slightly decreased depth of inspiration. Interval small amount of linear atelectasis or scarring in the left mid lung zone. Old, healed left 6th rib  fracture. No acute fracture and no pneumothorax.  IMPRESSION: 1. No acute abnormality. 2. Mildly progressive  cardiomegaly with stable mild pulmonary vascular congestion. 3. Interval small amount of linear atelectasis or scarring in the left mid lung zone.  Electronically Signed   By: Claudie Revering M.D.   On: 09/05/2019 14:00 DG Lumbar Spine Complete CLINICAL DATA:  Fall, back/rib pain  EXAM: LUMBAR SPINE - COMPLETE 4+ VIEW  COMPARISON:  None.  FINDINGS: Five lumbar-type vertebral bodies.  Normal lumbar lordosis.  No evidence of fracture or dislocation. Vertebral body heights and intervertebral disc spaces are maintained.  Mild degenerative changes of the lower lumbar spine.  Visualized bony pelvis appears intact.  Vascular calcifications.  IMPRESSION: Negative.  Electronically Signed   By: Julian Hy M.D.   On: 09/05/2019 13:57 Note: Reviewed        Physical Exam  General appearance: Well nourished, well developed, and well hydrated. In no apparent acute distress Mental status: Alert, oriented x 3 (person, place, & time)       Respiratory: No evidence of acute respiratory distress Eyes: PERLA Vitals: BP (!) 106/50   Pulse 92   Temp 98.2 F (36.8 C) (Oral)   Resp 18   Ht 5' 4"  (1.626 m)   Wt 150 lb (68 kg)   SpO2 96% Comment: 2LNC  BMI 25.75 kg/m  BMI: Estimated body mass index is 25.75 kg/m as calculated from the following:   Height as of this encounter: 5' 4"  (1.626 m).   Weight as of this encounter: 150 lb (68 kg). Ideal: Ideal body weight: 54.7 kg (120 lb 9.5 oz) Adjusted ideal body weight: 60 kg (132 lb 5.7 oz) Patient presents today in wheelchair. Mid and low back pain present. Pain with facet loading. Arthropathic arthralgia Assessment   Status Diagnosis  Controlled Controlled Controlled 1. Lumbar spondylosis   2. Lumbar degenerative disc disease   3. Chronic thoracic spine pain   4. Compression fracture of thoracic vertebra, unspecified thoracic vertebral level, sequela   5. Pain management contract signed   6. Chronic pain syndrome        Plan of Care   [name] has a current medication list which includes the following long-term medication(s): fluticasone-salmeterol, gabapentin, ipratropium, lovastatin, metoprolol tartrate, nitroglycerin, spiriva handihaler, [DISCONTINUED] omeprazole, furosemide, furosemide, lovastatin, metoprolol succinate, rivaroxaban, and [DISCONTINUED] lisinopril.  Pharmacotherapy (Medications Ordered): Meds ordered this encounter  Medications  . oxyCODONE (OXY IR/ROXICODONE) 5 MG immediate release tablet    Sig: Take 1 tablet (5 mg total) by mouth daily as needed for severe pain. Must last 30 days.    Dispense:  30 tablet    Refill:  0    Chronic Pain. (STOP Act - Not applicable). Fill one day early if closed on scheduled refill date.  Marland Kitchen oxyCODONE (OXY IR/ROXICODONE) 5 MG immediate release tablet    Sig: Take 1 tablet (5 mg total) by mouth daily as needed for severe pain. Must last 30 days.    Dispense:  30 tablet    Refill:  0    Chronic Pain. (STOP Act - Not applicable). Fill one day early if closed on scheduled refill date.    Follow-up plan:   Return in about 8 weeks (around 03/08/2021) for Medication Management, in person.   Recent Visits Date Type Provider Dept  12/05/20 Telemedicine Gillis Santa, MD [facility]  Showing recent visits within past 90 days and meeting all other requirements Today's Visits Date Type Provider Dept  01/11/21 Office Visit Gillis Santa, MD [facility]  Showing today's visits and meeting all other requirements Future Appointments Date Type Provider Dept  03/08/21 Appointment Gillis Santa, MD [facility]  Showing future appointments within next 90 days and meeting all other requirements  I discussed the assessment and treatment plan with the patient. The patient was provided an opportunity to ask questions and all were answered. The patient agreed with the plan and demonstrated an understanding of the  instructions.  Patient advised to call back or seek an in-person evaluation if the symptoms or condition worsens.  Duration of encounter: 30 minutes.  Note by: Gillis Santa, MD Date: 01/11/2021; Time: 3:25 PM

## 2021-03-08 ENCOUNTER — Encounter: Payer: Self-pay | Admitting: Student in an Organized Health Care Education/Training Program

## 2021-03-08 ENCOUNTER — Ambulatory Visit
Payer: Medicare Other | Attending: Student in an Organized Health Care Education/Training Program | Admitting: Student in an Organized Health Care Education/Training Program

## 2021-03-08 ENCOUNTER — Other Ambulatory Visit: Payer: Self-pay

## 2021-03-08 VITALS — BP 101/54 | HR 106 | Temp 97.1°F | Resp 18 | Ht 64.0 in | Wt 152.0 lb

## 2021-03-08 DIAGNOSIS — M546 Pain in thoracic spine: Secondary | ICD-10-CM | POA: Insufficient documentation

## 2021-03-08 DIAGNOSIS — M47816 Spondylosis without myelopathy or radiculopathy, lumbar region: Secondary | ICD-10-CM | POA: Diagnosis not present

## 2021-03-08 DIAGNOSIS — M51369 Other intervertebral disc degeneration, lumbar region without mention of lumbar back pain or lower extremity pain: Secondary | ICD-10-CM

## 2021-03-08 DIAGNOSIS — S22000S Wedge compression fracture of unspecified thoracic vertebra, sequela: Secondary | ICD-10-CM | POA: Diagnosis present

## 2021-03-08 DIAGNOSIS — G8929 Other chronic pain: Secondary | ICD-10-CM | POA: Diagnosis present

## 2021-03-08 DIAGNOSIS — M5136 Other intervertebral disc degeneration, lumbar region: Secondary | ICD-10-CM | POA: Insufficient documentation

## 2021-03-08 DIAGNOSIS — Z0289 Encounter for other administrative examinations: Secondary | ICD-10-CM | POA: Diagnosis present

## 2021-03-08 DIAGNOSIS — G894 Chronic pain syndrome: Secondary | ICD-10-CM

## 2021-03-08 MED ORDER — OXYCODONE HCL 5 MG PO TABS: 5.0000 mg | ORAL_TABLET | Freq: Two times a day (BID) | ORAL | 0 refills | Status: AC | PRN

## 2021-03-08 MED ORDER — GABAPENTIN 300 MG PO CAPS: 300.0000 mg | ORAL_CAPSULE | Freq: Every day | ORAL | 5 refills | Status: DC

## 2021-03-08 MED ORDER — OXYCODONE HCL 5 MG PO TABS: 5.0000 mg | ORAL_TABLET | Freq: Two times a day (BID) | ORAL | 0 refills | Status: DC | PRN

## 2021-03-08 NOTE — Progress Notes (Signed)
Safety precautions to be maintained throughout the outpatient stay will include: orient to surroundings, keep bed in low position, maintain call bell within reach at all times, provide assistance with transfer out of bed and ambulation.   Nursing Pain Medication Assessment:  Safety precautions to be maintained throughout the outpatient stay will include: orient to surroundings, keep bed in low position, maintain call bell within reach at all times, provide assistance with transfer out of bed and ambulation.  Medication Inspection Compliance: Pill count conducted under aseptic conditions, in front of the patient. Neither the pills nor the bottle was removed from the patient's sight at any time. Once count was completed pills were immediately returned to the patient in their original bottle.  Medication: Oxycodone IR Pill/Patch Count: 0 of 30 pills remain Pill/Patch Appearance: Markings consistent with prescribed medication Bottle Appearance: Standard pharmacy container. Clearly labeled. Filled Date: 2 / 09 / 2022 Last Medication intake:  Day before yesterday

## 2021-03-08 NOTE — Progress Notes (Signed)
PROVIDER NOTE: Information contained herein reflects review and annotations entered in association with encounter. Interpretation of such information and data should be left to medically-trained personnel. Information provided to patient can be located elsewhere in the medical record under "Patient Instructions". Document created using STT-dictation technology, any transcriptional errors that may result from process are unintentional.    Patient: Kellie Morris  Service Category: E/M  Provider: Gillis Santa, MD  DOB: 1956/10/17  DOS: 03/08/2021  Specialty: Interventional Pain Management  MRN: [id number]  Setting: Ambulatory outpatient  PCP: [facility]  Type: Established Patient    Referring Provider: [facility]  Location: Office  Delivery: Face-to-face     HPI  [name], a 65 y.o. year old female, is here today because of her Lumbar degenerative disc disease [M51.36]. [name] primary complain today is Back Pain and Medication Refill Last encounter: My last encounter with her was on 01/11/2021.  Pain Assessment: Severity of Chronic pain is reported as a 7 /10. Location: Back Lower,Mid,Right,Left/Denies. Onset: More than a month ago. Quality: Aching,Constant,Burning. Timing: Constant. Modifying factor(s): Heat packs. Vitals:  height is _0  (1.626 m) and weight is 152 lb (68.9 kg). Her temporal temperature is 97.1 F (36.2 C) (abnormal). Her blood pressure is 101/54 (abnormal) and her pulse is 106 (abnormal). Her respiration is 18 and oxygen saturation is 92%.   Reason for encounter: medication management.    Patient presents today for medication management.  At her last visit, she was prescribed oxycodone 5 mg to take primarily on her dialysis days.  She has been utilizing 5 mg daily and on some days she utilizes extra tablet given increased pain.  She is requesting to increase her oxycodone.  We discussed this in great detail.  She has a history of oxygen dependent COPD  and for this reason I would like to minimize her dependence on opioid medications.  Reasonable to increase her monthly quantity from 30-45.  She continues her gabapentin 300 mg nightly for neuropathic pain and restless leg syndrome.  I will refill that as below.  She also takes acetaminophen 500 mg 3 times daily as needed and applies Voltaren gel to her knees.   Pharmacotherapy Assessment   Analgesic: Oxycodone 5 mg daily as needed, quantity 45/month; MME equals 15   Monitoring: [place] PMP: PDMP reviewed during this encounter.       Pharmacotherapy: No side-effects or adverse reactions reported. Compliance: No problems identified. Effectiveness: Clinically acceptable.  Janne Napoleon, RN  03/08/2021  2:29 PM  Sign when Signing Visit Safety precautions to be maintained throughout the outpatient stay will include: orient to surroundings, keep bed in low position, maintain call bell within reach at all times, provide assistance with transfer out of bed and ambulation.   Nursing Pain Medication Assessment:  Safety precautions to be maintained throughout the outpatient stay will include: orient to surroundings, keep bed in low position, maintain call bell within reach at all times, provide assistance with transfer out of bed and ambulation.  Medication Inspection Compliance: Pill count conducted under aseptic conditions, in front of the patient. Neither the pills nor the bottle was removed from the patient's sight at any time. Once count was completed pills were immediately returned to the patient in their original bottle.  Medication: Oxycodone IR Pill/Patch Count: 0 of 30 pills remain Pill/Patch Appearance: Markings consistent with prescribed medication Bottle Appearance: Standard pharmacy container. Clearly labeled. Filled Date: 2 / 09 / 2022 Last Medication intake:  Day  before yesterday    UDS: No results found for: SUMMARY   ROS  Constitutional: Denies any fever or chills Gastrointestinal:  No reported hemesis, hematochezia, vomiting, or acute GI distress Musculoskeletal: +LBP Neurological: No reported episodes of acute onset apraxia, aphasia, dysarthria, agnosia, amnesia, paralysis, loss of coordination, or loss of consciousness  Medication Review  Fluticasone-Salmeterol, acetaminophen, apixaban, diclofenac Sodium, gabapentin, ipratropium, lisinopril, lovastatin, metoprolol succinate, metoprolol tartrate, midodrine, multivitamin, nitroGLYCERIN, omeprazole, oxyCODONE, rivaroxaban, sennosides-docusate sodium, thiamine, tiotropium, and torsemide  History Review  Allergy: [name] is allergic to codeine. Drug: [name]  reports no history of drug use. Alcohol:  reports current alcohol use. Tobacco:  reports that she has quit smoking. Her smoking use included cigarettes. She has a 40.00 pack-year smoking history. She has never used smokeless tobacco. Social: [name]  reports that she has quit smoking. Her smoking use included cigarettes. She has a 40.00 pack-year smoking history. She has never used smokeless tobacco. She reports current alcohol use. She reports that she does not use drugs. Medical:  has a past medical history of Allergy, Arthritis, CHF (congestive heart failure) ([place]), Chronic bronchitis ([place]), Chronic kidney disease, Collapse of lung, COPD (chronic obstructive pulmonary disease) ([place]), History of DVT (deep vein thrombosis) (1976), Hypertension, and On home O2. Surgical: [name]  has a past surgical history that includes Tubal ligation. Family: family history includes Alcohol abuse in her father; Heart attack in her brother and father.  Laboratory Chemistry Profile   Renal Lab Results  Component Value Date   BUN 13 05/04/2012   CREATININE 0.62 05/04/2012   GFRAA >60 05/04/2012   GFRNONAA >60 05/04/2012     Hepatic Lab Results  Component Value Date   AST 22 05/07/2013   ALT 18 05/07/2013   ALBUMIN 4.6 05/07/2013   ALKPHOS 76 05/07/2013     Electrolytes Lab  Results  Component Value Date   NA 137 05/04/2012   K 4.6 05/04/2012   CL 102 05/04/2012   CALCIUM 9.0 05/04/2012     Bone No results found for: VD25OH, VD125OH2TOT, IE3329JJ8, AC1660YT0, 25OHVITD1, 25OHVITD2, 25OHVITD3, TESTOFREE, TESTOSTERONE   Inflammation (CRP: Acute Phase) (ESR: Chronic Phase) No results found for: CRP, ESRSEDRATE, LATICACIDVEN     Note: Above Lab results reviewed.  Recent Imaging Review  DG Ribs Unilateral Right CLINICAL DATA:  Thoracic spine and bilateral rib pain following a fall at home 8 days ago.  EXAM: RIGHT RIBS - 2 VIEW  COMPARISON:  Chest and left rib radiographs obtained today and chest radiographs dated 12/14/2017.  FINDINGS: Multiple old, healed right rib fractures. No acute fracture or pneumothorax seen. Diffuse osteopenia.  IMPRESSION: No acute abnormality. Old, healed right rib fractures.  Electronically Signed   By: Claudie Revering M.D.   On: 09/05/2019 14:06 DG Thoracic Spine 2 View CLINICAL DATA:  Midthoracic back pain following a fall at home 8 days ago.  EXAM: THORACIC SPINE 2 VIEWS  COMPARISON:  Chest radiographs dated 12/14/2017. Chest and bilateral rib radiographs obtained today.  FINDINGS: Approximately 20% T10 superior endplate compression deformity with mild sclerosis and no visible fracture lines. No bony retropulsion seen. Stable mild scoliosis. No subluxations.  IMPRESSION: Approximately 20% T10 superior endplate compression fracture with mild sclerosis and no visible fracture lines, new since 12/14/2017.  Electronically Signed   By: Claudie Revering M.D.   On: 09/05/2019 14:05 DG Ribs Unilateral W/Chest Left CLINICAL DATA:  Severe bilateral mid rib pain, greater on the left, following a fall. The patient also has  thoracic spine pain at approximately the T5 level. Ex-smoker.  EXAM: LEFT RIBS AND CHEST - 3+ VIEW  COMPARISON:  Chest radiographs dated 12/14/2017  FINDINGS: Mildly progressive enlargement of  the cardiac silhouette with stable mild prominence of the pulmonary vasculature. Slightly decreased depth of inspiration. Interval small amount of linear atelectasis or scarring in the left mid lung zone. Old, healed left 6th rib fracture. No acute fracture and no pneumothorax.  IMPRESSION: 1. No acute abnormality. 2. Mildly progressive cardiomegaly with stable mild pulmonary vascular congestion. 3. Interval small amount of linear atelectasis or scarring in the left mid lung zone.  Electronically Signed   By: Claudie Revering M.D.   On: 09/05/2019 14:00 DG Lumbar Spine Complete CLINICAL DATA:  Fall, back/rib pain  EXAM: LUMBAR SPINE - COMPLETE 4+ VIEW  COMPARISON:  None.  FINDINGS: Five lumbar-type vertebral bodies.  Normal lumbar lordosis.  No evidence of fracture or dislocation. Vertebral body heights and intervertebral disc spaces are maintained.  Mild degenerative changes of the lower lumbar spine.  Visualized bony pelvis appears intact.  Vascular calcifications.  IMPRESSION: Negative.  Electronically Signed   By: Julian Hy M.D.   On: 09/05/2019 13:57 Note: Reviewed        Physical Exam  General appearance: Well nourished, well developed, and well hydrated. In no apparent acute distress Mental status: Alert, oriented x 3 (person, place, & time)       Respiratory: No evidence of acute respiratory distress Eyes: PERLA Vitals: BP (!) 101/54   Pulse (!) 106   Temp (!) 97.1 F (36.2 C) (Temporal)   Resp 18   Ht _0  (1.626 m)   Wt 152 lb (68.9 kg)   SpO2 92% Comment: 2 L 02  BMI 26.09 kg/m  BMI: Estimated body mass index is 26.09 kg/m as calculated from the following:   Height as of this encounter: _1  (1.626 m).   Weight as of this encounter: 152 lb (68.9 kg). Ideal: Ideal body weight: 54.7 kg (120 lb 9.5 oz) Adjusted ideal body weight: 60.4 kg (133 lb 2.5 oz)  Patient presents today in wheelchair. Mid and low back pain present. Pain with  facet loading. Arthropathic arthralgia  Assessment   Status Diagnosis  Persistent Persistent Persistent 1. Lumbar degenerative disc disease   2. Lumbar spondylosis   3. Chronic thoracic spine pain   4. Compression fracture of thoracic vertebra, unspecified thoracic vertebral level, sequela   5. Pain management contract signed   6. Chronic pain syndrome      Plan of Care   [name] has a current medication list which includes the following long-term medication(s): fluticasone-salmeterol, ipratropium, lovastatin, metoprolol tartrate, nitroglycerin, spiriva handihaler, [DISCONTINUED] omeprazole, gabapentin, metoprolol succinate, rivaroxaban, torsemide, and [DISCONTINUED] lisinopril.  Pharmacotherapy (Medications Ordered): Meds ordered this encounter  Medications  . oxyCODONE (OXY IR/ROXICODONE) 5 MG immediate release tablet    Sig: Take 1 tablet (5 mg total) by mouth every 12 (twelve) hours as needed for severe pain. Must last 30 days.    Dispense:  45 tablet    Refill:  0    Chronic Pain. (STOP Act - Not applicable). Fill one day early if closed on scheduled refill date.  Marland Kitchen oxyCODONE (OXY IR/ROXICODONE) 5 MG immediate release tablet    Sig: Take 1 tablet (5 mg total) by mouth every 12 (twelve) hours as needed for severe pain. Must last 30 days.    Dispense:  45 tablet    Refill:  0  Chronic Pain. (STOP Act - Not applicable). Fill one day early if closed on scheduled refill date.  Marland Kitchen oxyCODONE (OXY IR/ROXICODONE) 5 MG immediate release tablet    Sig: Take 1 tablet (5 mg total) by mouth every 12 (twelve) hours as needed for severe pain. Must last 30 days.    Dispense:  45 tablet    Refill:  0    Chronic Pain. (STOP Act - Not applicable). Fill one day early if closed on scheduled refill date.  . gabapentin (NEURONTIN) 300 MG capsule    Sig: Take 1 capsule (300 mg total) by mouth at bedtime.    Dispense:  30 capsule    Refill:  5   Follow-up plan:   Return in about 3  months (around 06/07/2021) for Medication Management, in person.   Recent Visits Date Type Provider Dept  01/11/21 Office Visit Gillis Santa, MD [facility]  Showing recent visits within past 90 days and meeting all other requirements Today's Visits Date Type Provider Dept  03/08/21 Office Visit Gillis Santa, MD [facility]  Showing today's visits and meeting all other requirements Future Appointments Date Type Provider Dept  04/26/21 Appointment Gillis Santa, MD [facility]  Showing future appointments within next 90 days and meeting all other requirements  I discussed the assessment and treatment plan with the patient. The patient was provided an opportunity to ask questions and all were answered. The patient agreed with the plan and demonstrated an understanding of the instructions.  Patient advised to call back or seek an in-person evaluation if the symptoms or condition worsens.  Duration of encounter: 14mnutes.  Note by: BGillis Santa MD Date: 03/08/2021; Time: 2:53 PM

## 2021-04-26 ENCOUNTER — Other Ambulatory Visit: Payer: Self-pay

## 2021-04-26 ENCOUNTER — Ambulatory Visit
Payer: Medicare Other | Attending: Student in an Organized Health Care Education/Training Program | Admitting: Student in an Organized Health Care Education/Training Program

## 2021-04-26 ENCOUNTER — Encounter: Payer: Self-pay | Admitting: Student in an Organized Health Care Education/Training Program

## 2021-04-26 VITALS — BP 108/70 | HR 60 | Temp 96.9°F | Resp 18 | Ht 64.0 in | Wt 149.0 lb

## 2021-04-26 DIAGNOSIS — G8929 Other chronic pain: Secondary | ICD-10-CM | POA: Insufficient documentation

## 2021-04-26 DIAGNOSIS — M546 Pain in thoracic spine: Secondary | ICD-10-CM | POA: Insufficient documentation

## 2021-04-26 DIAGNOSIS — Z0289 Encounter for other administrative examinations: Secondary | ICD-10-CM | POA: Diagnosis present

## 2021-04-26 DIAGNOSIS — M47816 Spondylosis without myelopathy or radiculopathy, lumbar region: Secondary | ICD-10-CM | POA: Insufficient documentation

## 2021-04-26 DIAGNOSIS — S22000S Wedge compression fracture of unspecified thoracic vertebra, sequela: Secondary | ICD-10-CM | POA: Insufficient documentation

## 2021-04-26 DIAGNOSIS — M5136 Other intervertebral disc degeneration, lumbar region: Secondary | ICD-10-CM | POA: Insufficient documentation

## 2021-04-26 DIAGNOSIS — G894 Chronic pain syndrome: Secondary | ICD-10-CM | POA: Insufficient documentation

## 2021-04-26 MED ORDER — OXYCODONE HCL 5 MG PO TABS: 5.0000 mg | ORAL_TABLET | Freq: Two times a day (BID) | ORAL | 0 refills | Status: AC | PRN

## 2021-04-26 MED ORDER — OXYCODONE HCL 5 MG PO TABS: 5.0000 mg | ORAL_TABLET | Freq: Two times a day (BID) | ORAL | 0 refills | Status: DC | PRN

## 2021-04-26 NOTE — Progress Notes (Signed)
Nursing Pain Medication Assessment:  Safety precautions to be maintained throughout the outpatient stay will include: orient to surroundings, keep bed in low position, maintain call bell within reach at all times, provide assistance with transfer out of bed and ambulation.  Medication Inspection Compliance: Pill count conducted under aseptic conditions, in front of the patient. Neither the pills nor the bottle was removed from the patient's sight at any time. Once count was completed pills were immediately returned to the patient in their original bottle.  Medication: Oxycodone IR Pill/Patch Count: 32 of 45 pills remain Pill/Patch Appearance: Markings consistent with prescribed medication Bottle Appearance: Standard pharmacy container. Clearly labeled. Filled Date: 05 / 13/ 2022 Last Medication intake:  Today

## 2021-04-26 NOTE — Progress Notes (Signed)
PROVIDER NOTE: Information contained herein reflects review and annotations entered in association with encounter. Interpretation of such information and data should be left to medically-trained personnel. Information provided to patient can be located elsewhere in the medical record under "Patient Instructions". Document created using STT-dictation technology, any transcriptional errors that may result from process are unintentional.    Patient: Kellie Morris  Service Category: E/M  Provider: Gillis Santa, MD  DOB: Feb 21, 1956  DOS: 04/26/2021  Specialty: Interventional Pain Management  MRN: [id number]  Setting: Ambulatory outpatient  PCP: [facility]  Type: Established Patient    Referring Provider: [facility]  Location: Office  Delivery: Face-to-face     HPI  [name], a 65 y.o. year old female, is here today because of her Compression fracture of thoracic vertebra, unspecified thoracic vertebral level, sequela [S22.000S]. [name] primary complain today is Back Pain (low) Last encounter: My last encounter with her was on 03/08/21  Pain Assessment: Severity of Chronic pain is reported as a 6 /10. Location: Back Lower/sometimes radiates down right leg. Onset: More than a month ago. Quality: Aching. Timing: Intermittent. Modifying factor(s): medications, heating pad. Vitals:  height is 5' 4"  (1.626 m) and weight is 149 lb (67.6 kg). Her temperature is 96.9 F (36.1 C) (abnormal). Her blood pressure is 108/70 and her pulse is 60. Her respiration is 18 and oxygen saturation is 96%.   Reason for encounter: medication management.    Patient presents today for medication management.  She presents today for med refill of her Oxycodone. She has a history of oxygen dependent COPD and for this reason I would like to minimize her dependence on opioid medications. She continues her gabapentin 300 mg nightly for neuropathic pain and restless leg syndrome.  She also takes acetaminophen  500 mg 3 times daily as needed and applies Voltaren gel to her knees.   Pharmacotherapy Assessment   Analgesic: Oxycodone 5 mg daily as needed, quantity 45/month; MME equals 15   Monitoring: [place] PMP: PDMP reviewed during this encounter.       Pharmacotherapy: No side-effects or adverse reactions reported. Compliance: No problems identified. Effectiveness: Clinically acceptable.  Dewayne Shorter, RN  04/26/2021 10:59 AM  Signed Nursing Pain Medication Assessment:  Safety precautions to be maintained throughout the outpatient stay will include: orient to surroundings, keep bed in low position, maintain call bell within reach at all times, provide assistance with transfer out of bed and ambulation.  Medication Inspection Compliance: Pill count conducted under aseptic conditions, in front of the patient. Neither the pills nor the bottle was removed from the patient's sight at any time. Once count was completed pills were immediately returned to the patient in their original bottle.  Medication: Oxycodone IR Pill/Patch Count: 32 of 45 pills remain Pill/Patch Appearance: Markings consistent with prescribed medication Bottle Appearance: Standard pharmacy container. Clearly labeled. Filled Date: 05 / 13/ 2022 Last Medication intake:  Today    UDS: No results found for: SUMMARY, ESRD on dialysis   ROS  Constitutional: Denies any fever or chills Gastrointestinal: No reported hemesis, hematochezia, vomiting, or acute GI distress Musculoskeletal: +LBP Neurological: No reported episodes of acute onset apraxia, aphasia, dysarthria, agnosia, amnesia, paralysis, loss of coordination, or loss of consciousness  Medication Review  Fluticasone-Salmeterol, acetaminophen, apixaban, diclofenac Sodium, gabapentin, ipratropium, lisinopril, lovastatin, metoprolol succinate, metoprolol tartrate, midodrine, multivitamin, nitroGLYCERIN, omeprazole, oxyCODONE, rivaroxaban, sennosides-docusate sodium, thiamine,  tiotropium, and torsemide  History Review  Allergy: [name] is allergic to codeine. Drug: Ms.  Kellie Morris  reports no history of drug use. Alcohol:  reports current alcohol use. Tobacco:  reports that she has quit smoking. Her smoking use included cigarettes. She has a 40.00 pack-year smoking history. She has never used smokeless tobacco. Social: [name]  reports that she has quit smoking. Her smoking use included cigarettes. She has a 40.00 pack-year smoking history. She has never used smokeless tobacco. She reports current alcohol use. She reports that she does not use drugs. Medical:  has a past medical history of Allergy, Arthritis, CHF (congestive heart failure) ([place]), Chronic bronchitis ([place]), Chronic kidney disease, Collapse of lung, COPD (chronic obstructive pulmonary disease) ([place]), History of DVT (deep vein thrombosis) (1976), Hypertension, and On home O2. Surgical: [name]  has a past surgical history that includes Tubal ligation. Family: family history includes Alcohol abuse in her father; Heart attack in her brother and father.  Laboratory Chemistry Profile   Renal Lab Results  Component Value Date   BUN 13 05/04/2012   CREATININE 0.62 05/04/2012   GFRAA >60 05/04/2012   GFRNONAA >60 05/04/2012     Hepatic Lab Results  Component Value Date   AST 22 05/07/2013   ALT 18 05/07/2013   ALBUMIN 4.6 05/07/2013   ALKPHOS 76 05/07/2013     Electrolytes Lab Results  Component Value Date   NA 137 05/04/2012   K 4.6 05/04/2012   CL 102 05/04/2012   CALCIUM 9.0 05/04/2012     Bone No results found for: VD25OH, VD125OH2TOT, PN3614ER1, VQ0086PY1, 25OHVITD1, 25OHVITD2, 25OHVITD3, TESTOFREE, TESTOSTERONE   Inflammation (CRP: Acute Phase) (ESR: Chronic Phase) No results found for: CRP, ESRSEDRATE, LATICACIDVEN     Note: Above Lab results reviewed.  Recent Imaging Review  DG Ribs Unilateral Right CLINICAL DATA:  Thoracic spine and bilateral rib pain following a fall at home 8 days  ago.  EXAM: RIGHT RIBS - 2 VIEW  COMPARISON:  Chest and left rib radiographs obtained today and chest radiographs dated 12/14/2017.  FINDINGS: Multiple old, healed right rib fractures. No acute fracture or pneumothorax seen. Diffuse osteopenia.  IMPRESSION: No acute abnormality. Old, healed right rib fractures.  Electronically Signed   By: Claudie Revering M.D.   On: 09/05/2019 14:06 DG Thoracic Spine 2 View CLINICAL DATA:  Midthoracic back pain following a fall at home 8 days ago.  EXAM: THORACIC SPINE 2 VIEWS  COMPARISON:  Chest radiographs dated 12/14/2017. Chest and bilateral rib radiographs obtained today.  FINDINGS: Approximately 20% T10 superior endplate compression deformity with mild sclerosis and no visible fracture lines. No bony retropulsion seen. Stable mild scoliosis. No subluxations.  IMPRESSION: Approximately 20% T10 superior endplate compression fracture with mild sclerosis and no visible fracture lines, new since 12/14/2017.  Electronically Signed   By: Claudie Revering M.D.   On: 09/05/2019 14:05 DG Ribs Unilateral W/Chest Left CLINICAL DATA:  Severe bilateral mid rib pain, greater on the left, following a fall. The patient also has thoracic spine pain at approximately the T5 level. Ex-smoker.  EXAM: LEFT RIBS AND CHEST - 3+ VIEW  COMPARISON:  Chest radiographs dated 12/14/2017  FINDINGS: Mildly progressive enlargement of the cardiac silhouette with stable mild prominence of the pulmonary vasculature. Slightly decreased depth of inspiration. Interval small amount of linear atelectasis or scarring in the left mid lung zone. Old, healed left 6th rib fracture. No acute fracture and no pneumothorax.  IMPRESSION: 1. No acute abnormality. 2. Mildly progressive cardiomegaly with stable mild pulmonary vascular congestion. 3. Interval small amount of linear atelectasis  or scarring in the left mid lung zone.  Electronically Signed   By: Claudie Revering  M.D.   On: 09/05/2019 14:00 DG Lumbar Spine Complete CLINICAL DATA:  Fall, back/rib pain  EXAM: LUMBAR SPINE - COMPLETE 4+ VIEW  COMPARISON:  None.  FINDINGS: Five lumbar-type vertebral bodies.  Normal lumbar lordosis.  No evidence of fracture or dislocation. Vertebral body heights and intervertebral disc spaces are maintained.  Mild degenerative changes of the lower lumbar spine.  Visualized bony pelvis appears intact.  Vascular calcifications.  IMPRESSION: Negative.  Electronically Signed   By: Julian Hy M.D.   On: 09/05/2019 13:57 Note: Reviewed        Physical Exam  General appearance: Well nourished, well developed, and well hydrated. In no apparent acute distress Mental status: Alert, oriented x 3 (person, place, & time)       Respiratory: 02 [place] Eyes: PERLA Vitals: BP 108/70   Pulse 60   Temp (!) 96.9 F (36.1 C)   Resp 18   Ht 5' 4"  (1.626 m)   Wt 149 lb (67.6 kg)   SpO2 96%   BMI 25.58 kg/m  BMI: Estimated body mass index is 25.58 kg/m as calculated from the following:   Height as of this encounter: 5' 4"  (1.626 m).   Weight as of this encounter: 149 lb (67.6 kg). Ideal: Ideal body weight: 54.7 kg (120 lb 9.5 oz) Adjusted ideal body weight: 59.9 kg (131 lb 15.3 oz)  Patient presents today in wheelchair.   Assessment   Status Diagnosis  Persistent Persistent Persistent 1. Compression fracture of thoracic vertebra, unspecified thoracic vertebral level, sequela   2. Lumbar degenerative disc disease   3. Lumbar spondylosis   4. Pain management contract signed   5. Chronic thoracic spine pain   6. Chronic pain syndrome      Plan of Care   [name] has a current medication list which includes the following long-term medication(s): fluticasone-salmeterol, gabapentin, ipratropium, lovastatin, metoprolol tartrate, spiriva handihaler, torsemide, metoprolol succinate, nitroglycerin, rivaroxaban, [DISCONTINUED] lisinopril, and  [DISCONTINUED] omeprazole.  Pharmacotherapy (Medications Ordered): Meds ordered this encounter  Medications  . oxyCODONE (OXY IR/ROXICODONE) 5 MG immediate release tablet    Sig: Take 1 tablet (5 mg total) by mouth every 12 (twelve) hours as needed for severe pain. Must last 30 days.    Dispense:  45 tablet    Refill:  0    Chronic Pain. (STOP Act - Not applicable). Fill one day early if closed on scheduled refill date.  Marland Kitchen oxyCODONE (OXY IR/ROXICODONE) 5 MG immediate release tablet    Sig: Take 1 tablet (5 mg total) by mouth every 12 (twelve) hours as needed for severe pain. Must last 30 days.    Dispense:  45 tablet    Refill:  0    Chronic Pain. (STOP Act - Not applicable). Fill one day early if closed on scheduled refill date.   Follow-up plan:   Return in about 3 months (around 08/01/2021) for Medication Management, in person.   Recent Visits Date Type Provider Dept  03/08/21 Office Visit Kellie Santa, MD [facility]  Showing recent visits within past 90 days and meeting all other requirements Today's Visits Date Type Provider Dept  04/26/21 Office Visit Kellie Santa, MD [facility]  Showing today's visits and meeting all other requirements Future Appointments No visits were found meeting these conditions. Showing future appointments within next 90 days and meeting all other requirements  I discussed  the assessment and treatment plan with the patient. The patient was provided an opportunity to ask questions and all were answered. The patient agreed with the plan and demonstrated an understanding of the instructions.  Patient advised to call back or seek an in-person evaluation if the symptoms or condition worsens.  Duration of encounter: 81mnutes.  Note by: BGillis Santa MD Date: 04/26/2021; Time: 12:33 PM

## 2021-07-03 ENCOUNTER — Other Ambulatory Visit: Payer: Self-pay

## 2021-07-03 ENCOUNTER — Encounter: Payer: Medicare Other | Attending: Internal Medicine

## 2021-07-03 DIAGNOSIS — I272 Pulmonary hypertension, unspecified: Secondary | ICD-10-CM | POA: Insufficient documentation

## 2021-07-03 NOTE — Progress Notes (Signed)
Virtual Visit completed. Patient informed on EP and RD appointment and 6 Minute walk test. Patient also informed of patient health questionnaires on My Chart. Patient Verbalizes understanding. Visit diagnosis can be found in [facility] 06/13/2021.

## 2021-07-17 ENCOUNTER — Other Ambulatory Visit: Payer: Self-pay

## 2021-07-17 VITALS — Ht 62.5 in | Wt 129.3 lb

## 2021-07-17 DIAGNOSIS — I272 Pulmonary hypertension, unspecified: Secondary | ICD-10-CM

## 2021-07-17 NOTE — Patient Instructions (Signed)
Patient Instructions  Patient Details  Name: Kellie Morris MRN: [id number] Date of Birth: Apr 22, 1956 Referring Provider:  French Ana, MD  Below are your personal goals for exercise, nutrition, and risk factors. Our goal is to help you stay on track towards obtaining and maintaining these goals. We will be discussing your progress on these goals with you throughout the program.  Initial Exercise Prescription:  Initial Exercise Prescription - 07/17/21 1100       Date of Initial Exercise RX and Referring Provider   Date 07/17/21    Referring Provider Marijean Bravo      Oxygen   Oxygen Continuous    Liters 2      Recumbant Bike   Level 1    RPM 60    Minutes 15    METs 1.7      NuStep   Level 1    SPM 80    Minutes 15    METs 1.7      Biostep-RELP   Level 1    SPM 50    Minutes 15    METs 1.7      Track   Laps 20    Minutes 15    METs 1.7      Prescription Details   Frequency (times per week) 3    Duration Progress to 30 minutes of continuous aerobic without signs/symptoms of physical distress      Intensity   THRR 40-80% of Max Heartrate 118-142    Ratings of Perceived Exertion 11-13    Perceived Dyspnea 0-4      Progression   Progression Continue to progress workloads to maintain intensity without signs/symptoms of physical distress.      Resistance Training   Training Prescription Yes    Weight 3 lb    Reps 10-15             Exercise Goals: Frequency: Be able to perform aerobic exercise two to three times per week in program working toward 2-5 days per week of home exercise.  Intensity: Work with a perceived exertion of 11 (fairly light) - 15 (hard) while following your exercise prescription.  We will make changes to your prescription with you as you progress through the program.   Duration: Be able to do 30 to 45 minutes of continuous aerobic exercise in addition to a 5 minute warm-up and a 5 minute cool-down routine.   Nutrition Goals: Your  personal nutrition goals will be established when you do your nutrition analysis with the dietician.  The following are general nutrition guidelines to follow: Cholesterol < '200mg'$ /day Sodium < '1500mg'$ /day Fiber: Women over 50 yrs - 21 grams per day  Personal Goals:  Personal Goals and Risk Factors at Admission - 07/17/21 1157       Core Components/Risk Factors/Patient Goals on Admission    Weight Management Yes;Weight Maintenance    Intervention Weight Management: Develop a combined nutrition and exercise program designed to reach desired caloric intake, while maintaining appropriate intake of nutrient and fiber, sodium and fats, and appropriate energy expenditure required for the weight goal.;Weight Management: Provide education and appropriate resources to help participant work on and attain dietary goals.;Weight Management/Obesity: Establish reasonable short term and long term weight goals.    Expected Outcomes Short Term: Continue to assess and modify interventions until short term weight is achieved;Long Term: Adherence to nutrition and physical activity/exercise program aimed toward attainment of established weight goal;Weight Maintenance: Understanding of the daily nutrition guidelines, which includes 25-35% calories  from fat, 7% or less cal from saturated fats, less than '200mg'$  cholesterol, less than 1.5gm of sodium, & 5 or more servings of fruits and vegetables daily;Understanding recommendations for meals to include 15-35% energy as protein, 25-35% energy from fat, 35-60% energy from carbohydrates, less than '200mg'$  of dietary cholesterol, 20-35 gm of total fiber daily;Understanding of distribution of calorie intake throughout the day with the consumption of 4-5 meals/snacks    Improve shortness of breath with ADL's Yes    Intervention Provide education, individualized exercise plan and daily activity instruction to help decrease symptoms of SOB with activities of daily living.    Expected  Outcomes Short Term: Improve cardiorespiratory fitness to achieve a reduction of symptoms when performing ADLs;Long Term: Be able to perform more ADLs without symptoms or delay the onset of symptoms    Increase knowledge of respiratory medications and ability to use respiratory devices properly  Yes    Intervention Provide education and demonstration as needed of appropriate use of medications, inhalers, and oxygen therapy.    Expected Outcomes Short Term: Achieves understanding of medications use. Understands that oxygen is a medication prescribed by physician. Demonstrates appropriate use of inhaler and oxygen therapy.;Long Term: Maintain appropriate use of medications, inhalers, and oxygen therapy.    Heart Failure Yes    Intervention Provide a combined exercise and nutrition program that is supplemented with education, support and counseling about heart failure. Directed toward relieving symptoms such as shortness of breath, decreased exercise tolerance, and extremity edema.    Expected Outcomes Improve functional capacity of life;Short term: Attendance in program 2-3 days a week with increased exercise capacity. Reported lower sodium intake. Reported increased fruit and vegetable intake. Reports medication compliance.;Short term: Daily weights obtained and reported for increase. Utilizing diuretic protocols set by physician.;Long term: Adoption of self-care skills and reduction of barriers for early signs and symptoms recognition and intervention leading to self-care maintenance.    Lipids Yes    Intervention Provide education and support for participant on nutrition & aerobic/resistive exercise along with prescribed medications to achieve LDL '70mg'$ , HDL >'40mg'$ .    Expected Outcomes Short Term: Participant states understanding of desired cholesterol values and is compliant with medications prescribed. Participant is following exercise prescription and nutrition guidelines.;Long Term: Cholesterol  controlled with medications as prescribed, with individualized exercise RX and with personalized nutrition plan. Value goals: LDL < '70mg'$ , HDL > 40 mg.             Tobacco Use Initial Evaluation: Social History   Tobacco Use  Smoking Status Former   Packs/day: 1.00   Years: 40.00   Pack years: 40.00   Types: Cigarettes   Quit date: 07/04/2019   Years since quitting: 2.0  Smokeless Tobacco Never    Exercise Goals and Review:  Exercise Goals     Row Name 07/17/21 1155             Exercise Goals   Increase Physical Activity Yes       Intervention Provide advice, education, support and counseling about physical activity/exercise needs.;Develop an individualized exercise prescription for aerobic and resistive training based on initial evaluation findings, risk stratification, comorbidities and participant's personal goals.       Expected Outcomes Short Term: Attend rehab on a regular basis to increase amount of physical activity.;Long Term: Add in home exercise to make exercise part of routine and to increase amount of physical activity.;Long Term: Exercising regularly at least 3-5 days a week.  Increase Strength and Stamina Yes       Intervention Provide advice, education, support and counseling about physical activity/exercise needs.;Develop an individualized exercise prescription for aerobic and resistive training based on initial evaluation findings, risk stratification, comorbidities and participant's personal goals.       Expected Outcomes Short Term: Increase workloads from initial exercise prescription for resistance, speed, and METs.;Short Term: Perform resistance training exercises routinely during rehab and add in resistance training at home;Long Term: Improve cardiorespiratory fitness, muscular endurance and strength as measured by increased METs and functional capacity (6MWT)       Able to understand and use rate of perceived exertion (RPE) scale Yes        Intervention Provide education and explanation on how to use RPE scale       Expected Outcomes Short Term: Able to use RPE daily in rehab to express subjective intensity level;Long Term:  Able to use RPE to guide intensity level when exercising independently       Able to understand and use Dyspnea scale Yes       Intervention Provide education and explanation on how to use Dyspnea scale       Expected Outcomes Short Term: Able to use Dyspnea scale daily in rehab to express subjective sense of shortness of breath during exertion;Long Term: Able to use Dyspnea scale to guide intensity level when exercising independently       Knowledge and understanding of Target Heart Rate Range (THRR) Yes       Intervention Provide education and explanation of THRR including how the numbers were predicted and where they are located for reference       Expected Outcomes Short Term: Able to state/look up THRR;Short Term: Able to use daily as guideline for intensity in rehab;Long Term: Able to use THRR to govern intensity when exercising independently       Able to check pulse independently Yes       Intervention Provide education and demonstration on how to check pulse in carotid and radial arteries.;Review the importance of being able to check your own pulse for safety during independent exercise       Expected Outcomes Short Term: Able to explain why pulse checking is important during independent exercise;Long Term: Able to check pulse independently and accurately       Understanding of Exercise Prescription Yes       Intervention Provide education, explanation, and written materials on patient's individual exercise prescription       Expected Outcomes Short Term: Able to explain program exercise prescription;Long Term: Able to explain home exercise prescription to exercise independently                Copy of goals given to participant.

## 2021-07-17 NOTE — Progress Notes (Signed)
Pulmonary Individual Treatment Plan  Patient Details  Name: Kellie Morris MRN: [id number] Date of Birth: Sep 20, 1956 Referring Provider:   Flowsheet Row Pulmonary Rehab from 07/17/2021 in [facility] Cardiac and Pulmonary Rehab  Referring Provider Marijean Bravo       Initial Encounter Date:  Flowsheet Row Pulmonary Rehab from 07/17/2021 in [facility] Cardiac and Pulmonary Rehab  Date 07/17/21       Visit Diagnosis: Pulmonary hypertension ([place])  Patient's Home Medications on Admission:  Current Outpatient Medications:    acetaminophen (TYLENOL) 500 MG tablet, Take by mouth., Disp: , Rfl:    apixaban (ELIQUIS) 2.5 MG TABS tablet, TAKE 1 TABLET (2.5 MG TOTAL) BY MOUTH TWO (2) TIMES A DAY., Disp: , Rfl:    apixaban (ELIQUIS) 5 MG TABS tablet, Take by mouth. (Patient not taking: Reported on 07/03/2021), Disp: , Rfl:    B Complex-Biotin-FA (SUPER QUINTS B-50) TABS, Take 1 tablet by mouth daily., Disp: , Rfl:    budesonide-formoterol (SYMBICORT) 160-4.5 MCG/ACT inhaler, Inhale into the lungs., Disp: , Rfl:    diclofenac Sodium (VOLTAREN) 1 % GEL, Apply topically. (Patient not taking: Reported on 07/03/2021), Disp: , Rfl:    Fluticasone-Salmeterol (ADVAIR) 250-50 MCG/DOSE AEPB, Inhale into the lungs. (Patient not taking: Reported on 07/03/2021), Disp: , Rfl:    gabapentin (NEURONTIN) 300 MG capsule, Take 1 capsule (300 mg total) by mouth at bedtime., Disp: 30 capsule, Rfl: 5   ipratropium (ATROVENT HFA) 17 MCG/ACT inhaler, Inhale into the lungs., Disp: , Rfl:    lovastatin (MEVACOR) 20 MG tablet, Take 1 tablet (20 mg total) by mouth at bedtime. (Patient not taking: Reported on 07/03/2021), Disp: 90 tablet, Rfl: 3   lovastatin (MEVACOR) 40 MG tablet, Take by mouth., Disp: , Rfl:    Methoxy PEG-Epoetin Beta (MIRCERA IJ), Mircera, Disp: , Rfl:    metoprolol succinate (TOPROL-XL) 100 MG 24 hr tablet, Take by mouth., Disp: , Rfl:    metoprolol tartrate (LOPRESSOR) 100 MG tablet, Take 50 mg by mouth. , Disp: , Rfl:    midodrine  (PROAMATINE) 5 MG tablet, Take 5 mg by mouth 3 (three) times daily with meals., Disp: , Rfl:    Multiple Vitamin (MULTIVITAMIN) tablet, Take 1 tablet by mouth daily., Disp: , Rfl:    nitroGLYCERIN (NITROSTAT) 0.4 MG SL tablet, Place 1 tablet (0.4 mg total) under the tongue every 5 (five) minutes as needed for chest pain., Disp: 25 tablet, Rfl: 6   oxyCODONE (OXY IR/ROXICODONE) 5 MG immediate release tablet, Take 1 tablet (5 mg total) by mouth every 12 (twelve) hours as needed for severe pain. Must last 30 days., Disp: 45 tablet, Rfl: 0   pantoprazole (PROTONIX) 40 MG tablet, Take by mouth., Disp: , Rfl:    rivaroxaban (XARELTO) 20 MG TABS tablet, Take by mouth., Disp: , Rfl:    sennosides-docusate sodium (SENOKOT-S) 8.6-50 MG tablet, Take 1 tablet by mouth 2 (two) times daily. (Patient not taking: Reported on 07/03/2021), Disp: , Rfl:    thiamine 100 MG tablet, Take by mouth. (Patient not taking: Reported on 07/03/2021), Disp: , Rfl:    tiotropium (SPIRIVA HANDIHALER) 18 MCG inhalation capsule, Place into inhaler and inhale., Disp: , Rfl:    torsemide (DEMADEX) 20 MG tablet, Take 20 mg by mouth daily., Disp: , Rfl:   Past Medical History: Past Medical History:  Diagnosis Date   Allergy    Arthritis    CHF (congestive heart failure) (HCC)    Chronic bronchitis (HCC)    Chronic kidney disease  Collapse of lung    COPD (chronic obstructive pulmonary disease) (HCC)    History of DVT (deep vein thrombosis) 1976   Hypertension    On home O2     Tobacco Use: Social History   Tobacco Use  Smoking Status Former   Packs/day: 1.00   Years: 40.00   Pack years: 40.00   Types: Cigarettes   Quit date: 07/04/2019   Years since quitting: 2.0  Smokeless Tobacco Never    Labs: Recent Review Scientist, physiological     Labs for ITP Cardiac and Pulmonary Rehab Latest Ref Rng & Units 05/07/2013   Cholestrol 100 - 199 mg/dL 172   LDLCALC 0 - 99 mg/dL 92   HDL >39 mg/dL 63   Trlycerides 0 - 149 mg/dL 83         Pulmonary Assessment Scores:  Pulmonary Assessment Scores     Row Name 07/17/21 1127         ADL UCSD   SOB Score total 40     Rest 2     Walk 2     Stairs 4     Bath 3     Dress 2     Shop 3           CAT Score   CAT Score 21           mMRC Score   mMRC Score 1              UCSD: Self-administered rating of dyspnea associated with activities of daily living (ADLs) 6-point scale (0 = "not at all" to 5 = "maximal or unable to do because of breathlessness")  Scoring Scores range from 0 to 120.  Minimally important difference is 5 units  CAT: CAT can identify the health impairment of COPD patients and is better correlated with disease progression.  CAT has a scoring range of zero to 40. The CAT score is classified into four groups of low (less than 10), medium (10 - 20), high (21-30) and very high (31-40) based on the impact level of disease on health status. A CAT score over 10 suggests significant symptoms.  A worsening CAT score could be explained by an exacerbation, poor medication adherence, poor inhaler technique, or progression of COPD or comorbid conditions.  CAT MCID is 2 points  mMRC: mMRC (Modified Medical Research Council) Dyspnea Scale is used to assess the degree of baseline functional disability in patients of respiratory disease due to dyspnea. No minimal important difference is established. A decrease in score of 1 point or greater is considered a positive change.   Pulmonary Function Assessment:  Pulmonary Function Assessment - 07/03/21 1414       Breath   Shortness of Breath Yes;Limiting activity             Exercise Target Goals: Exercise Program Goal: Individual exercise prescription set using results from initial 6 min walk test and THRR while considering  patient's activity barriers and safety.   Exercise Prescription Goal: Initial exercise prescription builds to 30-45 minutes a day of aerobic activity, 2-3 days per week.  Home  exercise guidelines will be given to patient during program as part of exercise prescription that the participant will acknowledge.  Education: Aerobic Exercise: - Group verbal and visual presentation on the components of exercise prescription. Introduces F.I.T.T principle from ACSM for exercise prescriptions.  Reviews F.I.T.T. principles of aerobic exercise including progression. Written material given at graduation.   Education: Resistance Exercise: -  Group verbal and visual presentation on the components of exercise prescription. Introduces F.I.T.T principle from ACSM for exercise prescriptions  Reviews F.I.T.T. principles of resistance exercise including progression. Written material given at graduation.    Education: Exercise & Equipment Safety: - Individual verbal instruction and demonstration of equipment use and safety with use of the equipment. Flowsheet Row Pulmonary Rehab from 07/17/2021 in [facility] Cardiac and Pulmonary Rehab  Date 07/17/21  Educator AS  Instruction Review Code 1- Verbalizes Understanding       Education: Exercise Physiology & General Exercise Guidelines: - Group verbal and written instruction with models to review the exercise physiology of the cardiovascular system and associated critical values. Provides general exercise guidelines with specific guidelines to those with heart or lung disease.    Education: Flexibility, Balance, Mind/Body Relaxation: - Group verbal and visual presentation with interactive activity on the components of exercise prescription. Introduces F.I.T.T principle from ACSM for exercise prescriptions. Reviews F.I.T.T. principles of flexibility and balance exercise training including progression. Also discusses the mind body connection.  Reviews various relaxation techniques to help reduce and manage stress (i.e. Deep breathing, progressive muscle relaxation, and visualization). Balance handout provided to take home. Written material given at  graduation.   Activity Barriers & Risk Stratification:   6 Minute Walk:  6 Minute Walk     Row Name 07/17/21 1134         6 Minute Walk   Phase Initial     Distance 480 feet     Walk Time 4.5 minutes     # of Rest Breaks 0     MPH 1.2     METS 1.72     RPE 12     Perceived Dyspnea  2     VO2 Peak 6     Symptoms Yes (comment)     Comments SOB     Resting HR 94 bpm     Resting BP 112/68     Resting Oxygen Saturation  100 %     Exercise Oxygen Saturation  during 6 min walk 80 %     Max Ex. HR 101 bpm     Max Ex. BP 118/60     2 Minute Post BP 118/68           Interval HR   1 Minute HR 94     2 Minute HR 93     3 Minute HR 95     4 Minute HR 98     5 Minute HR 101     6 Minute HR --  stopped at 5:00 min     2 Minute Post HR 95     Interval Heart Rate? Yes           Interval Oxygen   Interval Oxygen? Yes     Baseline Oxygen Saturation % 100 %     1 Minute Oxygen Saturation % 91 %     1 Minute Liters of Oxygen 2 L     2 Minute Oxygen Saturation % 94 %     2 Minute Liters of Oxygen 2 L     3 Minute Oxygen Saturation % 93 %     3 Minute Liters of Oxygen 2 L     4 Minute Oxygen Saturation % 90 %     4 Minute Liters of Oxygen 2 L     5 Minute Oxygen Saturation % 80 %     5 Minute Liters of Oxygen 2 L  6 Minute Liters of Oxygen 2 L     2 Minute Post Oxygen Saturation % 97 %     2 Minute Post Liters of Oxygen 2 L             Oxygen Initial Assessment:  Oxygen Initial Assessment - 07/03/21 1411       Home Oxygen   Home Oxygen Device Home Concentrator;E-Tanks    Sleep Oxygen Prescription Continuous;BiPAP    Liters per minute 2    Home Exercise Oxygen Prescription Continuous    Liters per minute 2    Home Resting Oxygen Prescription Continuous    Liters per minute 2    Compliance with Home Oxygen Use Yes      Initial 6 min Walk   Oxygen Used Continuous    Liters per minute 2      Program Oxygen Prescription   Program Oxygen Prescription  Continuous    Liters per minute 2      Intervention   Short Term Goals To learn and exhibit compliance with exercise, home and travel O2 prescription;To learn and demonstrate proper pursed lip breathing techniques or other breathing techniques. ;To learn and understand importance of maintaining oxygen saturations>88%;To learn and understand importance of monitoring SPO2 with pulse oximeter and demonstrate accurate use of the pulse oximeter.;To learn and demonstrate proper use of respiratory medications    Long  Term Goals Exhibits compliance with exercise, home  and travel O2 prescription;Verbalizes importance of monitoring SPO2 with pulse oximeter and return demonstration;Maintenance of O2 saturations>88%;Exhibits proper breathing techniques, such as pursed lip breathing or other method taught during program session;Compliance with respiratory medication;Demonstrates proper use of MDI's             Oxygen Re-Evaluation:   Oxygen Discharge (Final Oxygen Re-Evaluation):   Initial Exercise Prescription:  Initial Exercise Prescription - 07/17/21 1100       Date of Initial Exercise RX and Referring Provider   Date 07/17/21    Referring Provider Marijean Bravo      Oxygen   Oxygen Continuous    Liters 2      Recumbant Bike   Level 1    RPM 60    Minutes 15    METs 1.7      NuStep   Level 1    SPM 80    Minutes 15    METs 1.7      Biostep-RELP   Level 1    SPM 50    Minutes 15    METs 1.7      Track   Laps 20    Minutes 15    METs 1.7      Prescription Details   Frequency (times per week) 3    Duration Progress to 30 minutes of continuous aerobic without signs/symptoms of physical distress      Intensity   THRR 40-80% of Max Heartrate 118-142    Ratings of Perceived Exertion 11-13    Perceived Dyspnea 0-4      Progression   Progression Continue to progress workloads to maintain intensity without signs/symptoms of physical distress.      Resistance Training    Training Prescription Yes    Weight 3 lb    Reps 10-15             Perform Capillary Blood Glucose checks as needed.  Exercise Prescription Changes:   Exercise Prescription Changes     Row Name 07/17/21 1100  Response to Exercise   Blood Pressure (Admit) 112/68       Blood Pressure (Exercise) 118/60       Blood Pressure (Exit) 118/68       Heart Rate (Admit) 94 bpm       Heart Rate (Exercise) 101 bpm       Heart Rate (Exit) 95 bpm       Oxygen Saturation (Admit) 100 %       Oxygen Saturation (Exercise) 80 %       Oxygen Saturation (Exit) 91 %       Rating of Perceived Exertion (Exercise) 12       Perceived Dyspnea (Exercise) 2       Symptoms SOB                Exercise Comments:   Exercise Goals and Review:   Exercise Goals     Row Name 07/17/21 1155             Exercise Goals   Increase Physical Activity Yes       Intervention Provide advice, education, support and counseling about physical activity/exercise needs.;Develop an individualized exercise prescription for aerobic and resistive training based on initial evaluation findings, risk stratification, comorbidities and participant's personal goals.       Expected Outcomes Short Term: Attend rehab on a regular basis to increase amount of physical activity.;Long Term: Add in home exercise to make exercise part of routine and to increase amount of physical activity.;Long Term: Exercising regularly at least 3-5 days a week.       Increase Strength and Stamina Yes       Intervention Provide advice, education, support and counseling about physical activity/exercise needs.;Develop an individualized exercise prescription for aerobic and resistive training based on initial evaluation findings, risk stratification, comorbidities and participant's personal goals.       Expected Outcomes Short Term: Increase workloads from initial exercise prescription for resistance, speed, and METs.;Short Term:  Perform resistance training exercises routinely during rehab and add in resistance training at home;Long Term: Improve cardiorespiratory fitness, muscular endurance and strength as measured by increased METs and functional capacity (6MWT)       Able to understand and use rate of perceived exertion (RPE) scale Yes       Intervention Provide education and explanation on how to use RPE scale       Expected Outcomes Short Term: Able to use RPE daily in rehab to express subjective intensity level;Long Term:  Able to use RPE to guide intensity level when exercising independently       Able to understand and use Dyspnea scale Yes       Intervention Provide education and explanation on how to use Dyspnea scale       Expected Outcomes Short Term: Able to use Dyspnea scale daily in rehab to express subjective sense of shortness of breath during exertion;Long Term: Able to use Dyspnea scale to guide intensity level when exercising independently       Knowledge and understanding of Target Heart Rate Range (THRR) Yes       Intervention Provide education and explanation of THRR including how the numbers were predicted and where they are located for reference       Expected Outcomes Short Term: Able to state/look up THRR;Short Term: Able to use daily as guideline for intensity in rehab;Long Term: Able to use THRR to govern intensity when exercising independently       Able to check  pulse independently Yes       Intervention Provide education and demonstration on how to check pulse in carotid and radial arteries.;Review the importance of being able to check your own pulse for safety during independent exercise       Expected Outcomes Short Term: Able to explain why pulse checking is important during independent exercise;Long Term: Able to check pulse independently and accurately       Understanding of Exercise Prescription Yes       Intervention Provide education, explanation, and written materials on patient's  individual exercise prescription       Expected Outcomes Short Term: Able to explain program exercise prescription;Long Term: Able to explain home exercise prescription to exercise independently                Exercise Goals Re-Evaluation :   Discharge Exercise Prescription (Final Exercise Prescription Changes):  Exercise Prescription Changes - 07/17/21 1100       Response to Exercise   Blood Pressure (Admit) 112/68    Blood Pressure (Exercise) 118/60    Blood Pressure (Exit) 118/68    Heart Rate (Admit) 94 bpm    Heart Rate (Exercise) 101 bpm    Heart Rate (Exit) 95 bpm    Oxygen Saturation (Admit) 100 %    Oxygen Saturation (Exercise) 80 %    Oxygen Saturation (Exit) 91 %    Rating of Perceived Exertion (Exercise) 12    Perceived Dyspnea (Exercise) 2    Symptoms SOB             Nutrition:  Target Goals: Understanding of nutrition guidelines, daily intake of sodium '1500mg'$ , cholesterol '200mg'$ , calories 30% from fat and 7% or less from saturated fats, daily to have 5 or more servings of fruits and vegetables.  Education: All About Nutrition: -Group instruction provided by verbal, written material, interactive activities, discussions, models, and posters to present general guidelines for heart healthy nutrition including fat, fiber, MyPlate, the role of sodium in heart healthy nutrition, utilization of the nutrition label, and utilization of this knowledge for meal planning. Follow up email sent as well. Written material given at graduation.   Biometrics:  Pre Biometrics - 07/17/21 1156       Pre Biometrics   Height 5' 2.5" (1.588 m)    Weight 129 lb 4.8 oz (58.7 kg)    BMI (Calculated) 23.26    Single Leg Stand 1.6 seconds              Nutrition Therapy Plan and Nutrition Goals:   Nutrition Assessments:  MEDIFICTS Score Key: ?70 Need to make dietary changes  40-70 Heart Healthy Diet ? 40 Therapeutic Level Cholesterol Diet  Flowsheet Row Pulmonary  Rehab from 07/17/2021 in [facility] Cardiac and Pulmonary Rehab  Picture Your Plate Total Score on Admission 45      Picture Your Plate Scores: [id number] Unhealthy dietary pattern with much room for improvement. 41-50 Dietary pattern unlikely to meet recommendations for good health and room for improvement. 51-60 More healthful dietary pattern, with some room for improvement.  >60 Healthy dietary pattern, although there may be some specific behaviors that could be improved.   Nutrition Goals Re-Evaluation:   Nutrition Goals Discharge (Final Nutrition Goals Re-Evaluation):   Psychosocial: Target Goals: Acknowledge presence or absence of significant depression and/or stress, maximize coping skills, provide positive support system. Participant is able to verbalize types and ability to use techniques and skills needed for reducing stress and depression.   Education: Stress, Anxiety,  and Depression - Group verbal and visual presentation to define topics covered.  Reviews how body is impacted by stress, anxiety, and depression.  Also discusses healthy ways to reduce stress and to treat/manage anxiety and depression.  Written material given at graduation.   Education: Sleep Hygiene -Provides group verbal and written instruction about how sleep can affect your health.  Define sleep hygiene, discuss sleep cycles and impact of sleep habits. Review good sleep hygiene tips.    Initial Review & Psychosocial Screening:  Initial Psych Review & Screening - 07/03/21 1417       Initial Review   Current issues with Current Sleep Concerns;Current Depression      Family Dynamics   Good Support System? Yes    Comments She gets depressed sometimes but generally she is not a depressed person. Deb lives with her daughter now and is her main source for support.      Barriers   Psychosocial barriers to participate in program The patient should benefit from training in stress management and relaxation.       Screening Interventions   Interventions Encouraged to exercise;To provide support and resources with identified psychosocial needs;Provide feedback about the scores to participant    Expected Outcomes Short Term goal: Utilizing psychosocial counselor, staff and physician to assist with identification of specific Stressors or current issues interfering with healing process. Setting desired goal for each stressor or current issue identified.;Long Term Goal: Stressors or current issues are controlled or eliminated.;Short Term goal: Identification and review with participant of any Quality of Life or Depression concerns found by scoring the questionnaire.;Long Term goal: The participant improves quality of Life and PHQ9 Scores as seen by post scores and/or verbalization of changes             Quality of Life Scores:  Scores of 19 and below usually indicate a poorer quality of life in these areas.  A difference of  2-3 points is a clinically meaningful difference.  A difference of 2-3 points in the total score of the Quality of Life Index has been associated with significant improvement in overall quality of life, self-image, physical symptoms, and general health in studies assessing change in quality of life.  PHQ-9: Recent Review Flowsheet Data     Depression screen [facility] 2/9 07/17/2021 03/08/2021 01/11/2021 10/12/2020   Decreased Interest 0 0 0 0   Down, Depressed, Hopeless 0 0 0 0   PHQ - 2 Score 0 0 0 0   Altered sleeping 0 - - -   Tired, decreased energy 0 - - -   Change in appetite 0 - - -   Feeling bad or failure about yourself  0 - - -   Trouble concentrating 0 - - -   Moving slowly or fidgety/restless 0 - - -   Suicidal thoughts 0 - - -   PHQ-9 Score 0 - - -      Interpretation of Total Score  Total Score Depression Severity:  1-4 = Minimal depression, 5-9 = Mild depression, 10-14 = Moderate depression, 15-19 = Moderately severe depression, 20-27 = Severe depression   Psychosocial  Evaluation and Intervention:  Psychosocial Evaluation - 07/03/21 1419       Psychosocial Evaluation & Interventions   Interventions Encouraged to exercise with the program and follow exercise prescription;Relaxation education;Stress management education    Comments She gets depressed sometimes but generally she is not a depressed person. Deb lives with her daughter now and is her main source  for support.    Expected Outcomes Short: Exercise regularly to support mental health and notify staff of any changes. Long: maintain mental health and well being through teaching of rehab or prescribed medications independently.    Continue Psychosocial Services  Follow up required by staff             Psychosocial Re-Evaluation:   Psychosocial Discharge (Final Psychosocial Re-Evaluation):   Education: Education Goals: Education classes will be provided on a weekly basis, covering required topics. Participant will state understanding/return demonstration of topics presented.  Learning Barriers/Preferences:  Learning Barriers/Preferences - 07/03/21 1414       Learning Barriers/Preferences   Learning Barriers None    Learning Preferences None             General Pulmonary Education Topics:  Infection Prevention: - Provides verbal and written material to individual with discussion of infection control including proper hand washing and proper equipment cleaning during exercise session. Flowsheet Row Pulmonary Rehab from 07/17/2021 in [facility] Pulmonary Rehab  Date 07/17/21  Educator AS  Instruction Review Code 1- Verbalizes Understanding       Falls Prevention: - Provides verbal and written material to individual with discussion of falls prevention and safety. Flowsheet Row Pulmonary Rehab from 07/17/2021 in [facility] Cardiac and Pulmonary Rehab  Date 07/17/21  Educator AS  Instruction Review Code 1- Verbalizes Understanding       Chronic Lung Disease Review: - Group  verbal instruction with posters, models, PowerPoint presentations and videos,  to review new updates, new respiratory medications, new advancements in procedures and treatments. Providing information on websites and "800" numbers for continued self-education. Includes information about supplement oxygen, available portable oxygen systems, continuous and intermittent flow rates, oxygen safety, concentrators, and Medicare reimbursement for oxygen. Explanation of Pulmonary Drugs, including class, frequency, complications, importance of spacers, rinsing mouth after steroid MDI's, and proper cleaning methods for nebulizers. Review of basic lung anatomy and physiology related to function, structure, and complications of lung disease. Review of risk factors. Discussion about methods for diagnosing sleep apnea and types of masks and machines for OSA. Includes a review of the use of types of environmental controls: home humidity, furnaces, filters, dust mite/pet prevention, HEPA vacuums. Discussion about weather changes, air quality and the benefits of nasal washing. Instruction on Warning signs, infection symptoms, calling MD promptly, preventive modes, and value of vaccinations. Review of effective airway clearance, coughing and/or vibration techniques. Emphasizing that all should Create an Action Plan. Written material given at graduation.   AED/CPR: - Group verbal and written instruction with the use of models to demonstrate the basic use of the AED with the basic ABC's of resuscitation.    Anatomy and Cardiac Procedures: - Group verbal and visual presentation and models provide information about basic cardiac anatomy and function. Reviews the testing methods done to diagnose heart disease and the outcomes of the test results. Describes the treatment choices: Medical Management, Angioplasty, or Coronary Bypass Surgery for treating various heart conditions including Myocardial Infarction, Angina, Valve Disease,  and Cardiac Arrhythmias.  Written material given at graduation.   Medication Safety: - Group verbal and visual instruction to review commonly prescribed medications for heart and lung disease. Reviews the medication, class of the drug, and side effects. Includes the steps to properly store meds and maintain the prescription regimen.  Written material given at graduation.   Other: -Provides group and verbal instruction on various topics (see comments)   Knowledge Questionnaire Score:  Knowledge Questionnaire Score -  07/17/21 1131       Knowledge Questionnaire Score   Pre Score 15/18  lung disease, oxygen              Core Components/Risk Factors/Patient Goals at Admission:  Personal Goals and Risk Factors at Admission - 07/17/21 1157       Core Components/Risk Factors/Patient Goals on Admission    Weight Management Yes;Weight Maintenance    Intervention Weight Management: Develop a combined nutrition and exercise program designed to reach desired caloric intake, while maintaining appropriate intake of nutrient and fiber, sodium and fats, and appropriate energy expenditure required for the weight goal.;Weight Management: Provide education and appropriate resources to help participant work on and attain dietary goals.;Weight Management/Obesity: Establish reasonable short term and long term weight goals.    Expected Outcomes Short Term: Continue to assess and modify interventions until short term weight is achieved;Long Term: Adherence to nutrition and physical activity/exercise program aimed toward attainment of established weight goal;Weight Maintenance: Understanding of the daily nutrition guidelines, which includes 25-35% calories from fat, 7% or less cal from saturated fats, less than '200mg'$  cholesterol, less than 1.5gm of sodium, & 5 or more servings of fruits and vegetables daily;Understanding recommendations for meals to include 15-35% energy as protein, 25-35% energy from fat,  35-60% energy from carbohydrates, less than '200mg'$  of dietary cholesterol, 20-35 gm of total fiber daily;Understanding of distribution of calorie intake throughout the day with the consumption of 4-5 meals/snacks    Improve shortness of breath with ADL's Yes    Intervention Provide education, individualized exercise plan and daily activity instruction to help decrease symptoms of SOB with activities of daily living.    Expected Outcomes Short Term: Improve cardiorespiratory fitness to achieve a reduction of symptoms when performing ADLs;Long Term: Be able to perform more ADLs without symptoms or delay the onset of symptoms    Increase knowledge of respiratory medications and ability to use respiratory devices properly  Yes    Intervention Provide education and demonstration as needed of appropriate use of medications, inhalers, and oxygen therapy.    Expected Outcomes Short Term: Achieves understanding of medications use. Understands that oxygen is a medication prescribed by physician. Demonstrates appropriate use of inhaler and oxygen therapy.;Long Term: Maintain appropriate use of medications, inhalers, and oxygen therapy.    Heart Failure Yes    Intervention Provide a combined exercise and nutrition program that is supplemented with education, support and counseling about heart failure. Directed toward relieving symptoms such as shortness of breath, decreased exercise tolerance, and extremity edema.    Expected Outcomes Improve functional capacity of life;Short term: Attendance in program 2-3 days a week with increased exercise capacity. Reported lower sodium intake. Reported increased fruit and vegetable intake. Reports medication compliance.;Short term: Daily weights obtained and reported for increase. Utilizing diuretic protocols set by physician.;Long term: Adoption of self-care skills and reduction of barriers for early signs and symptoms recognition and intervention leading to self-care maintenance.     Lipids Yes    Intervention Provide education and support for participant on nutrition & aerobic/resistive exercise along with prescribed medications to achieve LDL '70mg'$ , HDL >'40mg'$ .    Expected Outcomes Short Term: Participant states understanding of desired cholesterol values and is compliant with medications prescribed. Participant is following exercise prescription and nutrition guidelines.;Long Term: Cholesterol controlled with medications as prescribed, with individualized exercise RX and with personalized nutrition plan. Value goals: LDL < '70mg'$ , HDL > 40 mg.  Education:Diabetes - Individual verbal and written instruction to review signs/symptoms of diabetes, desired ranges of glucose level fasting, after meals and with exercise. Acknowledge that pre and post exercise glucose checks will be done for 3 sessions at entry of program.   Know Your Numbers and Heart Failure: - Group verbal and visual instruction to discuss disease risk factors for cardiac and pulmonary disease and treatment options.  Reviews associated critical values for Overweight/Obesity, Hypertension, Cholesterol, and Diabetes.  Discusses basics of heart failure: signs/symptoms and treatments.  Introduces Heart Failure Zone chart for action plan for heart failure.  Written material given at graduation.   Core Components/Risk Factors/Patient Goals Review:    Core Components/Risk Factors/Patient Goals at Discharge (Final Review):    ITP Comments:  ITP Comments     Row Name 07/03/21 1421           ITP Comments Virtual Visit completed. Patient informed on EP and RD appointment and 6 Minute walk test. Patient also informed of patient health questionnaires on My Chart. Patient Verbalizes understanding. Visit diagnosis can be found in [facility] 06/13/2021.                Comments: initial ITP

## 2021-07-20 ENCOUNTER — Telehealth: Payer: Self-pay

## 2021-07-20 NOTE — Telephone Encounter (Signed)
   Kellie Morris DOB: 29-May-1956 MRN: [id number]   Kellie Morris  For purposes of improving physical access to our facilities, Kellie Morris is pleased to partner with third parties to provide Kellie Morris patients or other authorized individuals the option of convenient, on-demand ground transportation Morris (the Kellie Morris") through use of the technology service that enables users to request on-demand ground transportation from independent third-party providers.  By opting to use and accept these Kellie Morris, I, the undersigned, hereby agree on behalf of myself, and on behalf of any minor child using the Kellie Morris for whom I am the parent or legal guardian, as follows:  Kellie Morris provided to me are provided by independent third-party transportation providers who are not Kellie Morris or employees and who are unaffiliated with Kellie Morris. Kellie Morris is neither a transportation carrier nor a common or public carrier. Kellie Morris has no control over the quality or safety of the transportation that occurs as a result of the Kellie Morris. Kellie Morris cannot guarantee that any third-party transportation provider will complete any arranged transportation service. Kellie Morris makes no representation, warranty, or guarantee regarding the reliability, timeliness, quality, safety, suitability, or availability of any of the Kellie Morris or that they will be error free. I fully understand that traveling by vehicle involves risks and dangers of serious bodily injury, including permanent disability, paralysis, and death. I agree, on behalf of myself and on behalf of any minor child using the Kellie Morris for whom I am the parent or legal guardian, that the entire risk arising out of my use of the Kellie Morris remains solely with me, to the maximum extent permitted under applicable law. The Kellie Morris are provided "as is"  and "as available." Kellie Morris disclaims all representations and warranties, express, implied or statutory, not expressly set out in these terms, including the implied warranties of merchantability and fitness for a particular purpose. I hereby waive and release Kellie Morris, its agents, employees, officers, directors, representatives, insurers, attorneys, assigns, successors, subsidiaries, and affiliates from any and all past, present, or future claims, demands, liabilities, actions, causes of action, or suits of any kind directly or indirectly arising from acceptance and use of the Kellie Morris. I further waive and release Kellie Morris and its affiliates from all present and future Morris and responsibility for any injury or death to persons or damages to property caused by or related to the use of the Kellie Morris. I have read this Waiver and Release of Morris, and I understand the terms used in it and their legal significance. This Waiver is freely and voluntarily given with the understanding that my right (as well as the right of any minor child for whom I am the parent or legal guardian using the Kellie Morris) to legal recourse against  in connection with the Kellie Morris is knowingly surrendered in return for use of these Morris.   I attest that I read the consent document to Kellie Morris, gave Kellie Morris the opportunity to ask questions and answered the questions asked (if any). I affirm that Kellie Morris then provided consent for she's participation in this program.     Kellie Morris, read to daughter and she gave consent on behalf of Kellie Morris

## 2021-07-24 ENCOUNTER — Ambulatory Visit: Payer: Medicare Other

## 2021-07-24 ENCOUNTER — Telehealth: Payer: Self-pay

## 2021-07-24 DIAGNOSIS — I272 Pulmonary hypertension, unspecified: Secondary | ICD-10-CM

## 2021-07-24 NOTE — Telephone Encounter (Signed)
Daughter called- patient was in the ED yesterday. She needs to follow up with her PCP first to get a clearance to start exercise with rehab. Daughter also said patient will be out all next week due to other doctor appointments. Asked to give [place] a call at the end of next week to let us know how patient feels, how appointments went, and see what doctors say.

## 2021-07-26 ENCOUNTER — Ambulatory Visit: Payer: Medicare Other

## 2021-07-31 ENCOUNTER — Encounter: Payer: Self-pay | Admitting: Student in an Organized Health Care Education/Training Program

## 2021-07-31 ENCOUNTER — Ambulatory Visit: Payer: Medicare Other

## 2021-07-31 ENCOUNTER — Other Ambulatory Visit: Payer: Self-pay

## 2021-07-31 ENCOUNTER — Encounter: Payer: Medicare Other | Admitting: Student in an Organized Health Care Education/Training Program

## 2021-07-31 ENCOUNTER — Ambulatory Visit
Payer: Medicare Other | Attending: Student in an Organized Health Care Education/Training Program | Admitting: Student in an Organized Health Care Education/Training Program

## 2021-07-31 VITALS — BP 91/49 | HR 95 | Temp 97.1°F | Resp 20 | Ht 64.0 in | Wt 131.0 lb

## 2021-07-31 DIAGNOSIS — Z0289 Encounter for other administrative examinations: Secondary | ICD-10-CM | POA: Insufficient documentation

## 2021-07-31 DIAGNOSIS — S22000S Wedge compression fracture of unspecified thoracic vertebra, sequela: Secondary | ICD-10-CM | POA: Insufficient documentation

## 2021-07-31 DIAGNOSIS — G8929 Other chronic pain: Secondary | ICD-10-CM | POA: Insufficient documentation

## 2021-07-31 DIAGNOSIS — M546 Pain in thoracic spine: Secondary | ICD-10-CM | POA: Diagnosis present

## 2021-07-31 DIAGNOSIS — M5136 Other intervertebral disc degeneration, lumbar region: Secondary | ICD-10-CM | POA: Diagnosis not present

## 2021-07-31 DIAGNOSIS — M47816 Spondylosis without myelopathy or radiculopathy, lumbar region: Secondary | ICD-10-CM | POA: Diagnosis present

## 2021-07-31 DIAGNOSIS — G894 Chronic pain syndrome: Secondary | ICD-10-CM | POA: Diagnosis present

## 2021-07-31 MED ORDER — GABAPENTIN 300 MG PO CAPS: 300.0000 mg | ORAL_CAPSULE | Freq: Every day | ORAL | 5 refills | Status: AC

## 2021-07-31 MED ORDER — OXYCODONE HCL 5 MG PO TABS: 5.0000 mg | ORAL_TABLET | Freq: Two times a day (BID) | ORAL | 0 refills | Status: AC | PRN

## 2021-07-31 NOTE — Progress Notes (Signed)
Nursing Pain Medication Assessment:  Safety precautions to be maintained throughout the outpatient stay will include: orient to surroundings, keep bed in low position, maintain call bell within reach at all times, provide assistance with transfer out of bed and ambulation.  Medication Inspection Compliance: Pill count conducted under aseptic conditions, in front of the patient. Neither the pills nor the bottle was removed from the patient's sight at any time. Once count was completed pills were immediately returned to the patient in their original bottle.  Medication: Oxycodone IR Pill/Patch Count:  21 of 45 pills remain Pill/Patch Appearance: Markings consistent with prescribed medication Bottle Appearance: Standard pharmacy container. Clearly labeled. Filled Date: 8 / 12 / 2022 Last Medication intake:  Today Safety precautions to be maintained throughout the outpatient stay will include: orient to surroundings, keep bed in low position, maintain call bell within reach at all times, provide assistance with transfer out of bed and ambulation.

## 2021-07-31 NOTE — Progress Notes (Signed)
PROVIDER NOTE: Information contained herein reflects review and annotations entered in association with encounter. Interpretation of such information and data should be left to medically-trained personnel. Information provided to patient can be located elsewhere in the medical record under "Patient Instructions". Document created using STT-dictation technology, any transcriptional errors that may result from process are unintentional.    Patient: Kellie Morris  Service Category: E/M  Provider: Gillis Santa, MD  DOB: August 13, 1956  DOS: 07/31/2021  Specialty: Interventional Pain Management  MRN: [id number]  Setting: Ambulatory outpatient  PCP: [facility]  Type: Established Patient    Referring Provider: [facility]  Location: Office  Delivery: Face-to-face     HPI  Kellie Morris, a 65 y.o. year old female, is here today because of her Compression fracture of thoracic vertebra, unspecified thoracic vertebral level, sequela [S22.000S]. Kellie Morris primary complain today is Back Pain Last encounter: My last encounter with her was on 04/26/21  Pain Assessment: Severity of Chronic pain is reported as a 5 /10. Location: Back Lower/Denies. Onset: More than a month ago. Quality: Aching, Dull, Throbbing, Constant. Timing: Constant. Modifying factor(s): laying, repositioning, heating pad, meds. Vitals:  height is _0  (1.626 m) and weight is 131 lb (59.4 kg). Her temperature is 97.1 F (36.2 C) (abnormal). Her blood pressure is 91/49 (abnormal) and her pulse is 95. Her respiration is 20 and oxygen saturation is 93%.   Reason for encounter: medication management.    Patient presents today for medication management.  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 She is on gabapentin 300 mg nightly for neuropathic pain and restless leg syndrome.  She also takes acetaminophen 500 mg 3 times daily as needed and applies Voltaren gel to her  knees.   Pharmacotherapy Assessment   Analgesic: Oxycodone 5 mg daily as needed, quantity 45/month; MME equals 15   Monitoring: [place] PMP: PDMP reviewed during this encounter.       Pharmacotherapy: No side-effects or adverse reactions reported. Compliance: No problems identified. Effectiveness: Clinically acceptable.  Chauncey Fischer, RN  07/31/2021  1:12 PM  Sign when Signing Visit Nursing Pain Medication Assessment:  Safety precautions to be maintained throughout the outpatient stay will include: orient to surroundings, keep bed in low position, maintain call bell within reach at all times, provide assistance with transfer out of bed and ambulation.  Medication Inspection Compliance: Pill count conducted under aseptic conditions, in front of the patient. Neither the pills nor the bottle was removed from the patient's sight at any time. Once count was completed pills were immediately returned to the patient in their original bottle.  Medication: Oxycodone IR Pill/Patch Count:  21 of 45 pills remain Pill/Patch Appearance: Markings consistent with prescribed medication Bottle Appearance: Standard pharmacy container. Clearly labeled. Filled Date: 8 / 10 / 2022 Last Medication intake:  Today Safety precautions to be maintained throughout the outpatient stay will include: orient to surroundings, keep bed in low position, maintain call bell within reach at all times, provide assistance with transfer out of bed and ambulation.       UDS: No results found for: SUMMARY, ESRD on dialysis   ROS  Constitutional: Denies any fever or chills Gastrointestinal: No reported hemesis, hematochezia, vomiting, or acute GI distress Musculoskeletal:  +LBP Neurological: No reported episodes of acute onset apraxia, aphasia, dysarthria, agnosia, amnesia, paralysis, loss of coordination, or loss of consciousness  Medication Review  Fluticasone-Salmeterol, Methoxy PEG-Epoetin Beta, Super Quints B-50,  acetaminophen, apixaban, budesonide-formoterol, diclofenac Sodium, gabapentin, ipratropium, lisinopril, lovastatin, metoprolol succinate, metoprolol tartrate, midodrine, multivitamin, nitroGLYCERIN, omeprazole, oxyCODONE, pantoprazole, rivaroxaban, tiotropium, and torsemide  History Review  Allergy: Kellie Morris is allergic to codeine. Drug: Kellie Morris  reports no history of drug use. Alcohol:  reports current alcohol use. Tobacco:  reports that she quit smoking about 2 years ago. Her smoking use included cigarettes. She has a 40.00 pack-year smoking history. She has never used smokeless tobacco. Social: Kellie Morris  reports that she quit smoking about 2 years ago. Her smoking use included cigarettes. She has a 40.00 pack-year smoking history. She has never used smokeless tobacco. She reports current alcohol use. She reports that she does not use drugs. Medical:  has a past medical history of Allergy, Arthritis, CHF (congestive heart failure) ([place]), Chronic bronchitis ([place]), Chronic kidney disease, Collapse of lung, COPD (chronic obstructive pulmonary disease) ([place]), History of DVT (deep vein thrombosis) (1976), Hypertension, and On home O2. Surgical: Kellie Morris  has a past surgical history that includes Tubal ligation. Family: family history includes Alcohol abuse in her father; Heart attack in her brother and father.  Laboratory Chemistry Profile   Renal Lab Results  Component Value Date   BUN 13 05/04/2012   CREATININE 0.62 05/04/2012   GFRAA >60 05/04/2012   GFRNONAA >60 05/04/2012     Hepatic Lab Results  Component Value Date   AST 22 05/07/2013   ALT 18 05/07/2013   ALBUMIN 4.6 05/07/2013   ALKPHOS 76 05/07/2013     Electrolytes Lab Results  Component Value Date   NA 137 05/04/2012   K 4.6 05/04/2012   CL 102 05/04/2012   CALCIUM 9.0 05/04/2012     Bone No results found for: VD25OH, VD125OH2TOT, ES9233AQ7, MA2633HL4, 25OHVITD1, 25OHVITD2, 25OHVITD3, TESTOFREE, TESTOSTERONE    Inflammation (CRP: Acute Phase) (ESR: Chronic Phase) No results found for: CRP, ESRSEDRATE, LATICACIDVEN     Note: Above Lab results reviewed.  Recent Imaging Review  DG Ribs Unilateral Right CLINICAL DATA:  Thoracic spine and bilateral rib pain following a fall at home 8 days ago.  EXAM: RIGHT RIBS - 2 VIEW  COMPARISON:  Chest and left rib radiographs obtained today and chest radiographs dated 12/14/2017.  FINDINGS: Multiple old, healed right rib fractures. No acute fracture or pneumothorax seen. Diffuse osteopenia.  IMPRESSION: No acute abnormality. Old, healed right rib fractures.  Electronically Signed   By: Claudie Revering M.D.   On: 09/05/2019 14:06 DG Thoracic Spine 2 View CLINICAL DATA:  Midthoracic back pain following a fall at home 8 days ago.  EXAM: THORACIC SPINE 2 VIEWS  COMPARISON:  Chest radiographs dated 12/14/2017. Chest and bilateral rib radiographs obtained today.  FINDINGS: Approximately 20% T10 superior endplate compression deformity with mild sclerosis and no visible fracture lines. No bony retropulsion seen. Stable mild scoliosis. No subluxations.  IMPRESSION: Approximately 20% T10 superior endplate compression fracture with mild sclerosis and no visible fracture lines, new since 12/14/2017.  Electronically Signed   By: Claudie Revering M.D.   On: 09/05/2019 14:05 DG Ribs Unilateral W/Chest Left CLINICAL DATA:  Severe bilateral mid rib pain, greater on the left, following a fall. The patient also has thoracic spine pain at approximately the T5 level. Ex-smoker.  EXAM: LEFT RIBS AND CHEST - 3+ VIEW  COMPARISON:  Chest radiographs dated 12/14/2017  FINDINGS: Mildly progressive enlargement of the cardiac silhouette with stable mild prominence of the pulmonary vasculature. Slightly decreased depth of inspiration. Interval small amount of linear atelectasis or  scarring in the left mid lung zone. Old, healed left 6th rib fracture. No acute  fracture and no pneumothorax.  IMPRESSION: 1. No acute abnormality. 2. Mildly progressive cardiomegaly with stable mild pulmonary vascular congestion. 3. Interval small amount of linear atelectasis or scarring in the left mid lung zone.  Electronically Signed   By: Claudie Revering M.D.   On: 09/05/2019 14:00 DG Lumbar Spine Complete CLINICAL DATA:  Fall, back/rib pain  EXAM: LUMBAR SPINE - COMPLETE 4+ VIEW  COMPARISON:  None.  FINDINGS: Five lumbar-type vertebral bodies.  Normal lumbar lordosis.  No evidence of fracture or dislocation. Vertebral body heights and intervertebral disc spaces are maintained.  Mild degenerative changes of the lower lumbar spine.  Visualized bony pelvis appears intact.  Vascular calcifications.  IMPRESSION: Negative.  Electronically Signed   By: Julian Hy M.D.   On: 09/05/2019 13:57 Note: Reviewed        Physical Exam  General appearance: Well nourished, well developed, and well hydrated. In no apparent acute distress Mental status: Alert, oriented x 3 (person, place, & time)       Respiratory:  02   2L Eyes: PERLA Vitals: BP (!) 91/49   Pulse 95   Temp (!) 97.1 F (36.2 C)   Resp 20   Ht _0  (1.626 m)   Wt 131 lb (59.4 kg)   SpO2 93% Comment: 2 liters  BMI 22.49 kg/m  BMI: Estimated body mass index is 22.49 kg/m as calculated from the following:   Height as of this encounter: _1  (1.626 m).   Weight as of this encounter: 131 lb (59.4 kg). Ideal: Ideal body weight: 54.7 kg (120 lb 9.5 oz) Adjusted ideal body weight: 56.6 kg (124 lb 12.1 oz)  Patient presents today in wheelchair.   Assessment   Status Diagnosis  Persistent Persistent Persistent 1. Compression fracture of thoracic vertebra, unspecified thoracic vertebral level, sequela   2. Lumbar degenerative disc disease   3. Lumbar spondylosis   4. Pain management contract signed   5. Chronic thoracic spine pain   6. Chronic pain syndrome        Plan of Care   Kellie Morris has a current medication list which includes the following long-term medication(s): budesonide-formoterol, fluticasone-salmeterol, ipratropium, lovastatin, metoprolol tartrate, spiriva handihaler, torsemide, gabapentin, metoprolol succinate, nitroglycerin, rivaroxaban, [DISCONTINUED] lisinopril, and [DISCONTINUED] omeprazole.  Pharmacotherapy (Medications Ordered): Meds ordered this encounter  Medications   oxyCODONE (OXY IR/ROXICODONE) 5 MG immediate release tablet    Sig: Take 1 tablet (5 mg total) by mouth every 12 (twelve) hours as needed for severe pain. Must last 30 days.    Dispense:  45 tablet    Refill:  0    Chronic Pain. (STOP Act - Not applicable). Fill one day early if closed on scheduled refill date.   oxyCODONE (OXY IR/ROXICODONE) 5 MG immediate release tablet    Sig: Take 1 tablet (5 mg total) by mouth every 12 (twelve) hours as needed for severe pain. Must last 30 days.    Dispense:  45 tablet    Refill:  0    Chronic Pain. (STOP Act - Not applicable). Fill one day early if closed on scheduled refill date.   oxyCODONE (OXY IR/ROXICODONE) 5 MG immediate release tablet    Sig: Take 1 tablet (5 mg total) by mouth every 12 (twelve) hours as needed for severe pain. Must last 30 days.    Dispense:  45 tablet    Refill:  0  Chronic Pain. (STOP Act - Not applicable). Fill one day early if closed on scheduled refill date.   gabapentin (NEURONTIN) 300 MG capsule    Sig: Take 1 capsule (300 mg total) by mouth at bedtime.    Dispense:  30 capsule    Refill:  5   Follow-up plan:   Return in about 3 months (around 11/09/2021) for Medication Management, in person.   Recent Visits No visits were found meeting these conditions. Showing recent visits within past 90 days and meeting all other requirements Today's Visits Date Type Provider Dept  07/31/21 Office Visit Gillis Santa, MD [facility]  Showing today's visits and meeting all  other requirements Future Appointments No visits were found meeting these conditions. Showing future appointments within next 90 days and meeting all other requirements I discussed the assessment and treatment plan with the patient. The patient was provided an opportunity to ask questions and all were answered. The patient agreed with the plan and demonstrated an understanding of the instructions.  Patient advised to call back or seek an in-person evaluation if the symptoms or condition worsens.  Duration of encounter: 63mnutes.  Note by: BGillis Santa MD Date: 07/31/2021; Time: 2:15 PM

## 2021-08-02 ENCOUNTER — Ambulatory Visit: Payer: Medicare Other

## 2021-08-09 ENCOUNTER — Ambulatory Visit: Payer: Medicare Other

## 2021-08-09 ENCOUNTER — Encounter: Payer: Self-pay | Admitting: *Deleted

## 2021-08-09 ENCOUNTER — Telehealth: Payer: Self-pay

## 2021-08-09 DIAGNOSIS — I272 Pulmonary hypertension, unspecified: Secondary | ICD-10-CM

## 2021-08-09 NOTE — Progress Notes (Signed)
Pulmonary Individual Treatment Plan  Patient Details  Name: Kellie Morris MRN: [id number] Date of Birth: 22-Oct-1956 Referring Provider:   Flowsheet Row Pulmonary Rehab from 07/17/2021 in [facility] Cardiac and Pulmonary Rehab  Referring Provider Marijean Bravo       Initial Encounter Date:  Flowsheet Row Pulmonary Rehab from 07/17/2021 in [facility] Cardiac and Pulmonary Rehab  Date 07/17/21       Visit Diagnosis: Pulmonary hypertension ([place])  Patient's Home Medications on Admission:  Current Outpatient Medications:    acetaminophen (TYLENOL) 500 MG tablet, Take by mouth., Disp: , Rfl:    apixaban (ELIQUIS) 2.5 MG TABS tablet, TAKE 1 TABLET (2.5 MG TOTAL) BY MOUTH TWO (2) TIMES A DAY., Disp: , Rfl:    B Complex-Biotin-FA (SUPER QUINTS B-50) TABS, Take 1 tablet by mouth daily., Disp: , Rfl:    budesonide-formoterol (SYMBICORT) 160-4.5 MCG/ACT inhaler, Inhale into the lungs., Disp: , Rfl:    diclofenac Sodium (VOLTAREN) 1 % GEL, Apply topically., Disp: , Rfl:    Fluticasone-Salmeterol (ADVAIR) 250-50 MCG/DOSE AEPB, Inhale into the lungs., Disp: , Rfl:    gabapentin (NEURONTIN) 300 MG capsule, Take 1 capsule (300 mg total) by mouth at bedtime., Disp: 30 capsule, Rfl: 5   ipratropium (ATROVENT HFA) 17 MCG/ACT inhaler, Inhale into the lungs., Disp: , Rfl:    lovastatin (MEVACOR) 20 MG tablet, Take 1 tablet (20 mg total) by mouth at bedtime., Disp: 90 tablet, Rfl: 3   Methoxy PEG-Epoetin Beta (MIRCERA IJ), Mircera, Disp: , Rfl:    metoprolol succinate (TOPROL-XL) 100 MG 24 hr tablet, Take by mouth., Disp: , Rfl:    metoprolol tartrate (LOPRESSOR) 100 MG tablet, Take 50 mg by mouth. , Disp: , Rfl:    midodrine (PROAMATINE) 5 MG tablet, Take 5 mg by mouth 3 (three) times daily with meals., Disp: , Rfl:    Multiple Vitamin (MULTIVITAMIN) tablet, Take 1 tablet by mouth daily., Disp: , Rfl:    nitroGLYCERIN (NITROSTAT) 0.4 MG SL tablet, Place 1 tablet (0.4 mg total) under the tongue every 5 (five) minutes as needed  for chest pain., Disp: 25 tablet, Rfl: 6   [START ON 08/14/2021] oxyCODONE (OXY IR/ROXICODONE) 5 MG immediate release tablet, Take 1 tablet (5 mg total) by mouth every 12 (twelve) hours as needed for severe pain. Must last 30 days., Disp: 45 tablet, Rfl: 0   [START ON 09/13/2021] oxyCODONE (OXY IR/ROXICODONE) 5 MG immediate release tablet, Take 1 tablet (5 mg total) by mouth every 12 (twelve) hours as needed for severe pain. Must last 30 days., Disp: 45 tablet, Rfl: 0   [START ON 10/13/2021] oxyCODONE (OXY IR/ROXICODONE) 5 MG immediate release tablet, Take 1 tablet (5 mg total) by mouth every 12 (twelve) hours as needed for severe pain. Must last 30 days., Disp: 45 tablet, Rfl: 0   pantoprazole (PROTONIX) 40 MG tablet, Take by mouth., Disp: , Rfl:    rivaroxaban (XARELTO) 20 MG TABS tablet, Take by mouth., Disp: , Rfl:    tiotropium (SPIRIVA HANDIHALER) 18 MCG inhalation capsule, Place into inhaler and inhale., Disp: , Rfl:    torsemide (DEMADEX) 20 MG tablet, Take 20 mg by mouth daily., Disp: , Rfl:   Past Medical History: Past Medical History:  Diagnosis Date   Allergy    Arthritis    CHF (congestive heart failure) (HCC)    Chronic bronchitis (HCC)    Chronic kidney disease    Collapse of lung    COPD (chronic obstructive pulmonary disease) ([place])    History of  DVT (deep vein thrombosis) 1976   Hypertension    On home O2     Tobacco Use: Social History   Tobacco Use  Smoking Status Former   Packs/day: 1.00   Years: 40.00   Pack years: 40.00   Types: Cigarettes   Quit date: 07/04/2019   Years since quitting: 2.1  Smokeless Tobacco Never    Labs: Recent Review Scientist, physiological     Labs for ITP Cardiac and Pulmonary Rehab Latest Ref Rng & Units 05/07/2013   Cholestrol 100 - 199 mg/dL 172   LDLCALC 0 - 99 mg/dL 92   HDL >39 mg/dL 63   Trlycerides 0 - 149 mg/dL 83        Pulmonary Assessment Scores:  Pulmonary Assessment Scores     Row Name 07/17/21 1127         ADL  UCSD   SOB Score total 40     Rest 2     Walk 2     Stairs 4     Bath 3     Dress 2     Shop 3           CAT Score   CAT Score 21           mMRC Score   mMRC Score 1              UCSD: Self-administered rating of dyspnea associated with activities of daily living (ADLs) 6-point scale (0 = "not at all" to 5 = "maximal or unable to do because of breathlessness")  Scoring Scores range from 0 to 120.  Minimally important difference is 5 units  CAT: CAT can identify the health impairment of COPD patients and is better correlated with disease progression.  CAT has a scoring range of zero to 40. The CAT score is classified into four groups of low (less than 10), medium (10 - 20), high (21-30) and very high (31-40) based on the impact level of disease on health status. A CAT score over 10 suggests significant symptoms.  A worsening CAT score could be explained by an exacerbation, poor medication adherence, poor inhaler technique, or progression of COPD or comorbid conditions.  CAT MCID is 2 points  mMRC: mMRC (Modified Medical Research Council) Dyspnea Scale is used to assess the degree of baseline functional disability in patients of respiratory disease due to dyspnea. No minimal important difference is established. A decrease in score of 1 point or greater is considered a positive change.   Pulmonary Function Assessment:  Pulmonary Function Assessment - 07/03/21 1414       Breath   Shortness of Breath Yes;Limiting activity             Exercise Target Goals: Exercise Program Goal: Individual exercise prescription set using results from initial 6 min walk test and THRR while considering  patient's activity barriers and safety.   Exercise Prescription Goal: Initial exercise prescription builds to 30-45 minutes a day of aerobic activity, 2-3 days per week.  Home exercise guidelines will be given to patient during program as part of exercise prescription that the  participant will acknowledge.  Education: Aerobic Exercise: - Group verbal and visual presentation on the components of exercise prescription. Introduces F.I.T.T principle from ACSM for exercise prescriptions.  Reviews F.I.T.T. principles of aerobic exercise including progression. Written material given at graduation.   Education: Resistance Exercise: - Group verbal and visual presentation on the components of exercise prescription. Introduces F.I.T.T principle from ACSM for  exercise prescriptions  Reviews F.I.T.T. principles of resistance exercise including progression. Written material given at graduation.    Education: Exercise & Equipment Safety: - Individual verbal instruction and demonstration of equipment use and safety with use of the equipment. Flowsheet Row Pulmonary Rehab from 07/17/2021 in [facility] Cardiac and Pulmonary Rehab  Date 07/17/21  Educator AS  Instruction Review Code 1- Verbalizes Understanding       Education: Exercise Physiology & General Exercise Guidelines: - Group verbal and written instruction with models to review the exercise physiology of the cardiovascular system and associated critical values. Provides general exercise guidelines with specific guidelines to those with heart or lung disease.    Education: Flexibility, Balance, Mind/Body Relaxation: - Group verbal and visual presentation with interactive activity on the components of exercise prescription. Introduces F.I.T.T principle from ACSM for exercise prescriptions. Reviews F.I.T.T. principles of flexibility and balance exercise training including progression. Also discusses the mind body connection.  Reviews various relaxation techniques to help reduce and manage stress (i.e. Deep breathing, progressive muscle relaxation, and visualization). Balance handout provided to take home. Written material given at graduation.   Activity Barriers & Risk Stratification:   6 Minute Walk:  6 Minute Walk     Row  Name 07/17/21 1134         6 Minute Walk   Phase Initial     Distance 480 feet     Walk Time 4.5 minutes     # of Rest Breaks 0     MPH 1.2     METS 1.72     RPE 12     Perceived Dyspnea  2     VO2 Peak 6     Symptoms Yes (comment)     Comments SOB     Resting HR 94 bpm     Resting BP 112/68     Resting Oxygen Saturation  100 %     Exercise Oxygen Saturation  during 6 min walk 80 %     Max Ex. HR 101 bpm     Max Ex. BP 118/60     2 Minute Post BP 118/68           Interval HR   1 Minute HR 94     2 Minute HR 93     3 Minute HR 95     4 Minute HR 98     5 Minute HR 101     6 Minute HR --  stopped at 5:00 min     2 Minute Post HR 95     Interval Heart Rate? Yes           Interval Oxygen   Interval Oxygen? Yes     Baseline Oxygen Saturation % 100 %     1 Minute Oxygen Saturation % 91 %     1 Minute Liters of Oxygen 2 L     2 Minute Oxygen Saturation % 94 %     2 Minute Liters of Oxygen 2 L     3 Minute Oxygen Saturation % 93 %     3 Minute Liters of Oxygen 2 L     4 Minute Oxygen Saturation % 90 %     4 Minute Liters of Oxygen 2 L     5 Minute Oxygen Saturation % 80 %     5 Minute Liters of Oxygen 2 L     6 Minute Liters of Oxygen 2 L     2 Minute  Post Oxygen Saturation % 97 %     2 Minute Post Liters of Oxygen 2 L             Oxygen Initial Assessment:  Oxygen Initial Assessment - 07/03/21 1411       Home Oxygen   Home Oxygen Device Home Concentrator;E-Tanks    Sleep Oxygen Prescription Continuous;BiPAP    Liters per minute 2    Home Exercise Oxygen Prescription Continuous    Liters per minute 2    Home Resting Oxygen Prescription Continuous    Liters per minute 2    Compliance with Home Oxygen Use Yes      Initial 6 min Walk   Oxygen Used Continuous    Liters per minute 2      Program Oxygen Prescription   Program Oxygen Prescription Continuous    Liters per minute 2      Intervention   Short Term Goals To learn and exhibit compliance  with exercise, home and travel O2 prescription;To learn and demonstrate proper pursed lip breathing techniques or other breathing techniques. ;To learn and understand importance of maintaining oxygen saturations>88%;To learn and understand importance of monitoring SPO2 with pulse oximeter and demonstrate accurate use of the pulse oximeter.;To learn and demonstrate proper use of respiratory medications    Long  Term Goals Exhibits compliance with exercise, home  and travel O2 prescription;Verbalizes importance of monitoring SPO2 with pulse oximeter and return demonstration;Maintenance of O2 saturations>88%;Exhibits proper breathing techniques, such as pursed lip breathing or other method taught during program session;Compliance with respiratory medication;Demonstrates proper use of MDI's             Oxygen Re-Evaluation:   Oxygen Discharge (Final Oxygen Re-Evaluation):   Initial Exercise Prescription:  Initial Exercise Prescription - 07/17/21 1100       Date of Initial Exercise RX and Referring Provider   Date 07/17/21    Referring Provider Marijean Bravo      Oxygen   Oxygen Continuous    Liters 2      Recumbant Bike   Level 1    RPM 60    Minutes 15    METs 1.7      NuStep   Level 1    SPM 80    Minutes 15    METs 1.7      Biostep-RELP   Level 1    SPM 50    Minutes 15    METs 1.7      Track   Laps 20    Minutes 15    METs 1.7      Prescription Details   Frequency (times per week) 3    Duration Progress to 30 minutes of continuous aerobic without signs/symptoms of physical distress      Intensity   THRR 40-80% of Max Heartrate 118-142    Ratings of Perceived Exertion 11-13    Perceived Dyspnea 0-4      Progression   Progression Continue to progress workloads to maintain intensity without signs/symptoms of physical distress.      Resistance Training   Training Prescription Yes    Weight 3 lb    Reps 10-15             Perform Capillary Blood Glucose  checks as needed.  Exercise Prescription Changes:   Exercise Prescription Changes     Row Name 07/17/21 1100             Response to Exercise   Blood Pressure (Admit)  112/68       Blood Pressure (Exercise) 118/60       Blood Pressure (Exit) 118/68       Heart Rate (Admit) 94 bpm       Heart Rate (Exercise) 101 bpm       Heart Rate (Exit) 95 bpm       Oxygen Saturation (Admit) 100 %       Oxygen Saturation (Exercise) 80 %       Oxygen Saturation (Exit) 91 %       Rating of Perceived Exertion (Exercise) 12       Perceived Dyspnea (Exercise) 2       Symptoms SOB                Exercise Comments:   Exercise Goals and Review:   Exercise Goals     Row Name 07/17/21 1155             Exercise Goals   Increase Physical Activity Yes       Intervention Provide advice, education, support and counseling about physical activity/exercise needs.;Develop an individualized exercise prescription for aerobic and resistive training based on initial evaluation findings, risk stratification, comorbidities and participant's personal goals.       Expected Outcomes Short Term: Attend rehab on a regular basis to increase amount of physical activity.;Long Term: Add in home exercise to make exercise part of routine and to increase amount of physical activity.;Long Term: Exercising regularly at least 3-5 days a week.       Increase Strength and Stamina Yes       Intervention Provide advice, education, support and counseling about physical activity/exercise needs.;Develop an individualized exercise prescription for aerobic and resistive training based on initial evaluation findings, risk stratification, comorbidities and participant's personal goals.       Expected Outcomes Short Term: Increase workloads from initial exercise prescription for resistance, speed, and METs.;Short Term: Perform resistance training exercises routinely during rehab and add in resistance training at home;Long Term:  Improve cardiorespiratory fitness, muscular endurance and strength as measured by increased METs and functional capacity (6MWT)       Able to understand and use rate of perceived exertion (RPE) scale Yes       Intervention Provide education and explanation on how to use RPE scale       Expected Outcomes Short Term: Able to use RPE daily in rehab to express subjective intensity level;Long Term:  Able to use RPE to guide intensity level when exercising independently       Able to understand and use Dyspnea scale Yes       Intervention Provide education and explanation on how to use Dyspnea scale       Expected Outcomes Short Term: Able to use Dyspnea scale daily in rehab to express subjective sense of shortness of breath during exertion;Long Term: Able to use Dyspnea scale to guide intensity level when exercising independently       Knowledge and understanding of Target Heart Rate Range (THRR) Yes       Intervention Provide education and explanation of THRR including how the numbers were predicted and where they are located for reference       Expected Outcomes Short Term: Able to state/look up THRR;Short Term: Able to use daily as guideline for intensity in rehab;Long Term: Able to use THRR to govern intensity when exercising independently       Able to check pulse independently Yes  Intervention Provide education and demonstration on how to check pulse in carotid and radial arteries.;Review the importance of being able to check your own pulse for safety during independent exercise       Expected Outcomes Short Term: Able to explain why pulse checking is important during independent exercise;Long Term: Able to check pulse independently and accurately       Understanding of Exercise Prescription Yes       Intervention Provide education, explanation, and written materials on patient's individual exercise prescription       Expected Outcomes Short Term: Able to explain program exercise  prescription;Long Term: Able to explain home exercise prescription to exercise independently                Exercise Goals Re-Evaluation :   Discharge Exercise Prescription (Final Exercise Prescription Changes):  Exercise Prescription Changes - 07/17/21 1100       Response to Exercise   Blood Pressure (Admit) 112/68    Blood Pressure (Exercise) 118/60    Blood Pressure (Exit) 118/68    Heart Rate (Admit) 94 bpm    Heart Rate (Exercise) 101 bpm    Heart Rate (Exit) 95 bpm    Oxygen Saturation (Admit) 100 %    Oxygen Saturation (Exercise) 80 %    Oxygen Saturation (Exit) 91 %    Rating of Perceived Exertion (Exercise) 12    Perceived Dyspnea (Exercise) 2    Symptoms SOB             Nutrition:  Target Goals: Understanding of nutrition guidelines, daily intake of sodium '1500mg'$ , cholesterol '200mg'$ , calories 30% from fat and 7% or less from saturated fats, daily to have 5 or more servings of fruits and vegetables.  Education: All About Nutrition: -Group instruction provided by verbal, written material, interactive activities, discussions, models, and posters to present general guidelines for heart healthy nutrition including fat, fiber, MyPlate, the role of sodium in heart healthy nutrition, utilization of the nutrition label, and utilization of this knowledge for meal planning. Follow up email sent as well. Written material given at graduation.   Biometrics:  Pre Biometrics - 07/17/21 1156       Pre Biometrics   Height 5' 2.5" (1.588 m)    Weight 129 lb 4.8 oz (58.7 kg)    BMI (Calculated) 23.26    Single Leg Stand 1.6 seconds              Nutrition Therapy Plan and Nutrition Goals:   Nutrition Assessments:  MEDIFICTS Score Key: ?70 Need to make dietary changes  40-70 Heart Healthy Diet ? 40 Therapeutic Level Cholesterol Diet  Flowsheet Row Pulmonary Rehab from 07/17/2021 in [facility] Cardiac and Pulmonary Rehab  Picture Your Plate Total Score on Admission  45      Picture Your Plate Scores: [id number] Unhealthy dietary pattern with much room for improvement. 41-50 Dietary pattern unlikely to meet recommendations for good health and room for improvement. 51-60 More healthful dietary pattern, with some room for improvement.  >60 Healthy dietary pattern, although there may be some specific behaviors that could be improved.   Nutrition Goals Re-Evaluation:   Nutrition Goals Discharge (Final Nutrition Goals Re-Evaluation):   Psychosocial: Target Goals: Acknowledge presence or absence of significant depression and/or stress, maximize coping skills, provide positive support system. Participant is able to verbalize types and ability to use techniques and skills needed for reducing stress and depression.   Education: Stress, Anxiety, and Depression - Group verbal and visual presentation to  define topics covered.  Reviews how body is impacted by stress, anxiety, and depression.  Also discusses healthy ways to reduce stress and to treat/manage anxiety and depression.  Written material given at graduation.   Education: Sleep Hygiene -Provides group verbal and written instruction about how sleep can affect your health.  Define sleep hygiene, discuss sleep cycles and impact of sleep habits. Review good sleep hygiene tips.    Initial Review & Psychosocial Screening:  Initial Psych Review & Screening - 07/03/21 1417       Initial Review   Current issues with Current Sleep Concerns;Current Depression      Family Dynamics   Good Support System? Yes    Comments She gets depressed sometimes but generally she is not a depressed person. Deb lives with her daughter now and is her main source for support.      Barriers   Psychosocial barriers to participate in program The patient should benefit from training in stress management and relaxation.      Screening Interventions   Interventions Encouraged to exercise;To provide support and resources with  identified psychosocial needs;Provide feedback about the scores to participant    Expected Outcomes Short Term goal: Utilizing psychosocial counselor, staff and physician to assist with identification of specific Stressors or current issues interfering with healing process. Setting desired goal for each stressor or current issue identified.;Long Term Goal: Stressors or current issues are controlled or eliminated.;Short Term goal: Identification and review with participant of any Quality of Life or Depression concerns found by scoring the questionnaire.;Long Term goal: The participant improves quality of Life and PHQ9 Scores as seen by post scores and/or verbalization of changes             Quality of Life Scores:  Scores of 19 and below usually indicate a poorer quality of life in these areas.  A difference of  2-3 points is a clinically meaningful difference.  A difference of 2-3 points in the total score of the Quality of Life Index has been associated with significant improvement in overall quality of life, self-image, physical symptoms, and general health in studies assessing change in quality of life.  PHQ-9: Recent Review Flowsheet Data     Depression screen [facility] 2/9 07/17/2021 03/08/2021 01/11/2021 10/12/2020   Decreased Interest 0 0 0 0   Down, Depressed, Hopeless 0 0 0 0   PHQ - 2 Score 0 0 0 0   Altered sleeping 0 - - -   Tired, decreased energy 0 - - -   Change in appetite 0 - - -   Feeling bad or failure about yourself  0 - - -   Trouble concentrating 0 - - -   Moving slowly or fidgety/restless 0 - - -   Suicidal thoughts 0 - - -   PHQ-9 Score 0 - - -      Interpretation of Total Score  Total Score Depression Severity:  1-4 = Minimal depression, 5-9 = Mild depression, 10-14 = Moderate depression, 15-19 = Moderately severe depression, 20-27 = Severe depression   Psychosocial Evaluation and Intervention:  Psychosocial Evaluation - 07/03/21 1419       Psychosocial Evaluation  & Interventions   Interventions Encouraged to exercise with the program and follow exercise prescription;Relaxation education;Stress management education    Comments She gets depressed sometimes but generally she is not a depressed person. Deb lives with her daughter now and is her main source for support.    Expected Outcomes Short: Exercise  regularly to support mental health and notify staff of any changes. Long: maintain mental health and well being through teaching of rehab or prescribed medications independently.    Continue Psychosocial Services  Follow up required by staff             Psychosocial Re-Evaluation:   Psychosocial Discharge (Final Psychosocial Re-Evaluation):   Education: Education Goals: Education classes will be provided on a weekly basis, covering required topics. Participant will state understanding/return demonstration of topics presented.  Learning Barriers/Preferences:  Learning Barriers/Preferences - 07/03/21 1414       Learning Barriers/Preferences   Learning Barriers None    Learning Preferences None             General Pulmonary Education Topics:  Infection Prevention: - Provides verbal and written material to individual with discussion of infection control including proper hand washing and proper equipment cleaning during exercise session. Flowsheet Row Pulmonary Rehab from 07/17/2021 in [facility] Cardiac and Pulmonary Rehab  Date 07/17/21  Educator AS  Instruction Review Code 1- Verbalizes Understanding       Falls Prevention: - Provides verbal and written material to individual with discussion of falls prevention and safety. Flowsheet Row Pulmonary Rehab from 07/17/2021 in [facility] Cardiac and Pulmonary Rehab  Date 07/17/21  Educator AS  Instruction Review Code 1- Verbalizes Understanding       Chronic Lung Disease Review: - Group verbal instruction with posters, models, PowerPoint presentations and videos,  to review new updates, new  respiratory medications, new advancements in procedures and treatments. Providing information on websites and "800" numbers for continued self-education. Includes information about supplement oxygen, available portable oxygen systems, continuous and intermittent flow rates, oxygen safety, concentrators, and Medicare reimbursement for oxygen. Explanation of Pulmonary Drugs, including class, frequency, complications, importance of spacers, rinsing mouth after steroid MDI's, and proper cleaning methods for nebulizers. Review of basic lung anatomy and physiology related to function, structure, and complications of lung disease. Review of risk factors. Discussion about methods for diagnosing sleep apnea and types of masks and machines for OSA. Includes a review of the use of types of environmental controls: home humidity, furnaces, filters, dust mite/pet prevention, HEPA vacuums. Discussion about weather changes, air quality and the benefits of nasal washing. Instruction on Warning signs, infection symptoms, calling MD promptly, preventive modes, and value of vaccinations. Review of effective airway clearance, coughing and/or vibration techniques. Emphasizing that all should Create an Action Plan. Written material given at graduation.   AED/CPR: - Group verbal and written instruction with the use of models to demonstrate the basic use of the AED with the basic ABC's of resuscitation.    Anatomy and Cardiac Procedures: - Group verbal and visual presentation and models provide information about basic cardiac anatomy and function. Reviews the testing methods done to diagnose heart disease and the outcomes of the test results. Describes the treatment choices: Medical Management, Angioplasty, or Coronary Bypass Surgery for treating various heart conditions including Myocardial Infarction, Angina, Valve Disease, and Cardiac Arrhythmias.  Written material given at graduation.   Medication Safety: - Group verbal and  visual instruction to review commonly prescribed medications for heart and lung disease. Reviews the medication, class of the drug, and side effects. Includes the steps to properly store meds and maintain the prescription regimen.  Written material given at graduation.   Other: -Provides group and verbal instruction on various topics (see comments)   Knowledge Questionnaire Score:  Knowledge Questionnaire Score - 07/17/21 1131  Knowledge Questionnaire Score   Pre Score 15/18  lung disease, oxygen              Core Components/Risk Factors/Patient Goals at Admission:  Personal Goals and Risk Factors at Admission - 07/17/21 1157       Core Components/Risk Factors/Patient Goals on Admission    Weight Management Yes;Weight Maintenance    Intervention Weight Management: Develop a combined nutrition and exercise program designed to reach desired caloric intake, while maintaining appropriate intake of nutrient and fiber, sodium and fats, and appropriate energy expenditure required for the weight goal.;Weight Management: Provide education and appropriate resources to help participant work on and attain dietary goals.;Weight Management/Obesity: Establish reasonable short term and long term weight goals.    Expected Outcomes Short Term: Continue to assess and modify interventions until short term weight is achieved;Long Term: Adherence to nutrition and physical activity/exercise program aimed toward attainment of established weight goal;Weight Maintenance: Understanding of the daily nutrition guidelines, which includes 25-35% calories from fat, 7% or less cal from saturated fats, less than '200mg'$  cholesterol, less than 1.5gm of sodium, & 5 or more servings of fruits and vegetables daily;Understanding recommendations for meals to include 15-35% energy as protein, 25-35% energy from fat, 35-60% energy from carbohydrates, less than '200mg'$  of dietary cholesterol, 20-35 gm of total fiber  daily;Understanding of distribution of calorie intake throughout the day with the consumption of 4-5 meals/snacks    Improve shortness of breath with ADL's Yes    Intervention Provide education, individualized exercise plan and daily activity instruction to help decrease symptoms of SOB with activities of daily living.    Expected Outcomes Short Term: Improve cardiorespiratory fitness to achieve a reduction of symptoms when performing ADLs;Long Term: Be able to perform more ADLs without symptoms or delay the onset of symptoms    Increase knowledge of respiratory medications and ability to use respiratory devices properly  Yes    Intervention Provide education and demonstration as needed of appropriate use of medications, inhalers, and oxygen therapy.    Expected Outcomes Short Term: Achieves understanding of medications use. Understands that oxygen is a medication prescribed by physician. Demonstrates appropriate use of inhaler and oxygen therapy.;Long Term: Maintain appropriate use of medications, inhalers, and oxygen therapy.    Heart Failure Yes    Intervention Provide a combined exercise and nutrition program that is supplemented with education, support and counseling about heart failure. Directed toward relieving symptoms such as shortness of breath, decreased exercise tolerance, and extremity edema.    Expected Outcomes Improve functional capacity of life;Short term: Attendance in program 2-3 days a week with increased exercise capacity. Reported lower sodium intake. Reported increased fruit and vegetable intake. Reports medication compliance.;Short term: Daily weights obtained and reported for increase. Utilizing diuretic protocols set by physician.;Long term: Adoption of self-care skills and reduction of barriers for early signs and symptoms recognition and intervention leading to self-care maintenance.    Lipids Yes    Intervention Provide education and support for participant on nutrition &  aerobic/resistive exercise along with prescribed medications to achieve LDL '70mg'$ , HDL >'40mg'$ .    Expected Outcomes Short Term: Participant states understanding of desired cholesterol values and is compliant with medications prescribed. Participant is following exercise prescription and nutrition guidelines.;Long Term: Cholesterol controlled with medications as prescribed, with individualized exercise RX and with personalized nutrition plan. Value goals: LDL < '70mg'$ , HDL > 40 mg.             Education:Diabetes - Individual verbal and written  instruction to review signs/symptoms of diabetes, desired ranges of glucose level fasting, after meals and with exercise. Acknowledge that pre and post exercise glucose checks will be done for 3 sessions at entry of program.   Know Your Numbers and Heart Failure: - Group verbal and visual instruction to discuss disease risk factors for cardiac and pulmonary disease and treatment options.  Reviews associated critical values for Overweight/Obesity, Hypertension, Cholesterol, and Diabetes.  Discusses basics of heart failure: signs/symptoms and treatments.  Introduces Heart Failure Zone chart for action plan for heart failure.  Written material given at graduation.   Core Components/Risk Factors/Patient Goals Review:    Core Components/Risk Factors/Patient Goals at Discharge (Final Review):    ITP Comments:  ITP Comments     Row Name 07/03/21 1421 07/17/21 1208 07/24/21 1123 08/09/21 0640     ITP Comments Virtual Visit completed. Patient informed on EP and RD appointment and 6 Minute walk test. Patient also informed of patient health questionnaires on My Chart. Patient Verbalizes understanding. Visit diagnosis can be found in [facility] 06/13/2021. Completed 6MWT and gym orientation. Initial ITP created and sent for review to Dr Ottie Glazier, Medical Director. Patient called out the next couple of weeks. She was in the ED yesterday for COPD exacerbation and has a  couple doctors appointments in the next week. Patient needs to see PCP first before coming back. They will call and keep [place] updated. 30 Day review completed. Medical Director ITP review done, changes made as directed, and signed approval by Medical Director.             Comments:

## 2021-08-09 NOTE — Telephone Encounter (Signed)
Patient is out from rehab, she has not seen her doctor yet for follow up and is getting clearance to come back. Out tentatively until 9/12 or TBD

## 2021-08-14 ENCOUNTER — Ambulatory Visit: Payer: Medicare Other

## 2021-08-16 ENCOUNTER — Ambulatory Visit: Payer: Medicare Other

## 2021-08-17 NOTE — Telephone Encounter (Signed)
Attempted to call patient again for update, phone did not ring and voicemail not set up yet.

## 2021-08-21 ENCOUNTER — Ambulatory Visit: Payer: Medicare Other

## 2021-08-23 ENCOUNTER — Ambulatory Visit: Payer: Medicare Other

## 2021-08-28 ENCOUNTER — Telehealth: Payer: Self-pay

## 2021-08-28 ENCOUNTER — Ambulatory Visit: Payer: Medicare Other

## 2021-08-28 NOTE — Progress Notes (Signed)
Pulmonary Individual Treatment Plan  Patient Details  Name: Kellie Morris MRN: [id number] Date of Birth: [birth year] Referring Provider:   Flowsheet Row Pulmonary Rehab from 07/17/2021 in [facility] Cardiac and Pulmonary Rehab  Referring Provider Marijean Bravo       Initial Encounter Date:  Flowsheet Row Pulmonary Rehab from 07/17/2021 in [facility] Cardiac and Pulmonary Rehab  Date 07/17/21       Visit Diagnosis: No diagnosis found.  Patient's Home Medications on Admission:  Current Outpatient Medications:    acetaminophen (TYLENOL) 500 MG tablet, Take by mouth., Disp: , Rfl:    apixaban (ELIQUIS) 2.5 MG TABS tablet, TAKE 1 TABLET (2.5 MG TOTAL) BY MOUTH TWO (2) TIMES A DAY., Disp: , Rfl:    B Complex-Biotin-FA (SUPER QUINTS B-50) TABS, Take 1 tablet by mouth daily., Disp: , Rfl:    budesonide-formoterol (SYMBICORT) 160-4.5 MCG/ACT inhaler, Inhale into the lungs., Disp: , Rfl:    diclofenac Sodium (VOLTAREN) 1 % GEL, Apply topically., Disp: , Rfl:    Fluticasone-Salmeterol (ADVAIR) 250-50 MCG/DOSE AEPB, Inhale into the lungs., Disp: , Rfl:    gabapentin (NEURONTIN) 300 MG capsule, Take 1 capsule (300 mg total) by mouth at bedtime., Disp: 30 capsule, Rfl: 5   ipratropium (ATROVENT HFA) 17 MCG/ACT inhaler, Inhale into the lungs., Disp: , Rfl:    lovastatin (MEVACOR) 20 MG tablet, Take 1 tablet (20 mg total) by mouth at bedtime., Disp: 90 tablet, Rfl: 3   Methoxy PEG-Epoetin Beta (MIRCERA IJ), Mircera, Disp: , Rfl:    metoprolol succinate (TOPROL-XL) 100 MG 24 hr tablet, Take by mouth., Disp: , Rfl:    metoprolol tartrate (LOPRESSOR) 100 MG tablet, Take 50 mg by mouth. , Disp: , Rfl:    midodrine (PROAMATINE) 5 MG tablet, Take 5 mg by mouth 3 (three) times daily with meals., Disp: , Rfl:    Multiple Vitamin (MULTIVITAMIN) tablet, Take 1 tablet by mouth daily., Disp: , Rfl:    nitroGLYCERIN (NITROSTAT) 0.4 MG SL tablet, Place 1 tablet (0.4 mg total) under the tongue every 5 (five) minutes as needed for chest  pain., Disp: 25 tablet, Rfl: 6   oxyCODONE (OXY IR/ROXICODONE) 5 MG immediate release tablet, Take 1 tablet (5 mg total) by mouth every 12 (twelve) hours as needed for severe pain. Must last 30 days., Disp: 45 tablet, Rfl: 0   [START ON 09/13/2021] oxyCODONE (OXY IR/ROXICODONE) 5 MG immediate release tablet, Take 1 tablet (5 mg total) by mouth every 12 (twelve) hours as needed for severe pain. Must last 30 days., Disp: 45 tablet, Rfl: 0   [START ON 10/13/2021] oxyCODONE (OXY IR/ROXICODONE) 5 MG immediate release tablet, Take 1 tablet (5 mg total) by mouth every 12 (twelve) hours as needed for severe pain. Must last 30 days., Disp: 45 tablet, Rfl: 0   pantoprazole (PROTONIX) 40 MG tablet, Take by mouth., Disp: , Rfl:    rivaroxaban (XARELTO) 20 MG TABS tablet, Take by mouth., Disp: , Rfl:    tiotropium (SPIRIVA HANDIHALER) 18 MCG inhalation capsule, Place into inhaler and inhale., Disp: , Rfl:    torsemide (DEMADEX) 20 MG tablet, Take 20 mg by mouth daily., Disp: , Rfl:   Past Medical History: Past Medical History:  Diagnosis Date   Allergy    Arthritis    CHF (congestive heart failure) (HCC)    Chronic bronchitis (HCC)    Chronic kidney disease    Collapse of lung    COPD (chronic obstructive pulmonary disease) (HCC)    History of DVT (deep vein  thrombosis) 1976   Hypertension    On home O2     Tobacco Use: Social History   Tobacco Use  Smoking Status Former   Packs/day: 1.00   Years: 40.00   Pack years: 40.00   Types: Cigarettes   Quit date: 07/04/2019   Years since quitting: 2.1  Smokeless Tobacco Never    Labs: Recent Review Scientist, physiological     Labs for ITP Cardiac and Pulmonary Rehab Latest Ref Rng & Units 05/07/2013   Cholestrol 100 - 199 mg/dL 172   LDLCALC 0 - 99 mg/dL 92   HDL >39 mg/dL 63   Trlycerides 0 - 149 mg/dL 83        Pulmonary Assessment Scores:  Pulmonary Assessment Scores     Row Name 07/17/21 1127         ADL UCSD   SOB Score total 40      Rest 2     Walk 2     Stairs 4     Bath 3     Dress 2     Shop 3           CAT Score   CAT Score 21           mMRC Score   mMRC Score 1              UCSD: Self-administered rating of dyspnea associated with activities of daily living (ADLs) 6-point scale (0 = "not at all" to 5 = "maximal or unable to do because of breathlessness")  Scoring Scores range from 0 to 120.  Minimally important difference is 5 units  CAT: CAT can identify the health impairment of COPD patients and is better correlated with disease progression.  CAT has a scoring range of zero to 40. The CAT score is classified into four groups of low (less than 10), medium (10 - 20), high (21-30) and very high (31-40) based on the impact level of disease on health status. A CAT score over 10 suggests significant symptoms.  A worsening CAT score could be explained by an exacerbation, poor medication adherence, poor inhaler technique, or progression of COPD or comorbid conditions.  CAT MCID is 2 points  mMRC: mMRC (Modified Medical Research Council) Dyspnea Scale is used to assess the degree of baseline functional disability in patients of respiratory disease due to dyspnea. No minimal important difference is established. A decrease in score of 1 point or greater is considered a positive change.   Pulmonary Function Assessment:  Pulmonary Function Assessment - 07/03/21 1414       Breath   Shortness of Breath Yes;Limiting activity             Exercise Target Goals: Exercise Program Goal: Individual exercise prescription set using results from initial 6 min walk test and THRR while considering  patient's activity barriers and safety.   Exercise Prescription Goal: Initial exercise prescription builds to 30-45 minutes a day of aerobic activity, 2-3 days per week.  Home exercise guidelines will be given to patient during program as part of exercise prescription that the participant will  acknowledge.  Education: Aerobic Exercise: - Group verbal and visual presentation on the components of exercise prescription. Introduces F.I.T.T principle from ACSM for exercise prescriptions.  Reviews F.I.T.T. principles of aerobic exercise including progression. Written material given at graduation.   Education: Resistance Exercise: - Group verbal and visual presentation on the components of exercise prescription. Introduces F.I.T.T principle from ACSM for exercise prescriptions  Reviews F.I.T.T. principles of resistance exercise including progression. Written material given at graduation.    Education: Exercise & Equipment Safety: - Individual verbal instruction and demonstration of equipment use and safety with use of the equipment. Flowsheet Row Pulmonary Rehab from 07/17/2021 in [facility] Cardiac and Pulmonary Rehab  Date 07/17/21  Educator AS  Instruction Review Code 1- Verbalizes Understanding       Education: Exercise Physiology & General Exercise Guidelines: - Group verbal and written instruction with models to review the exercise physiology of the cardiovascular system and associated critical values. Provides general exercise guidelines with specific guidelines to those with heart or lung disease.    Education: Flexibility, Balance, Mind/Body Relaxation: - Group verbal and visual presentation with interactive activity on the components of exercise prescription. Introduces F.I.T.T principle from ACSM for exercise prescriptions. Reviews F.I.T.T. principles of flexibility and balance exercise training including progression. Also discusses the mind body connection.  Reviews various relaxation techniques to help reduce and manage stress (i.e. Deep breathing, progressive muscle relaxation, and visualization). Balance handout provided to take home. Written material given at graduation.   Activity Barriers & Risk Stratification:   6 Minute Walk:  6 Minute Walk     Row Name 07/17/21  1134         6 Minute Walk   Phase Initial     Distance 480 feet     Walk Time 4.5 minutes     # of Rest Breaks 0     MPH 1.2     METS 1.72     RPE 12     Perceived Dyspnea  2     VO2 Peak 6     Symptoms Yes (comment)     Comments SOB     Resting HR 94 bpm     Resting BP 112/68     Resting Oxygen Saturation  100 %     Exercise Oxygen Saturation  during 6 min walk 80 %     Max Ex. HR 101 bpm     Max Ex. BP 118/60     2 Minute Post BP 118/68           Interval HR   1 Minute HR 94     2 Minute HR 93     3 Minute HR 95     4 Minute HR 98     5 Minute HR 101     6 Minute HR --  stopped at 5:00 min     2 Minute Post HR 95     Interval Heart Rate? Yes           Interval Oxygen   Interval Oxygen? Yes     Baseline Oxygen Saturation % 100 %     1 Minute Oxygen Saturation % 91 %     1 Minute Liters of Oxygen 2 L     2 Minute Oxygen Saturation % 94 %     2 Minute Liters of Oxygen 2 L     3 Minute Oxygen Saturation % 93 %     3 Minute Liters of Oxygen 2 L     4 Minute Oxygen Saturation % 90 %     4 Minute Liters of Oxygen 2 L     5 Minute Oxygen Saturation % 80 %     5 Minute Liters of Oxygen 2 L     6 Minute Liters of Oxygen 2 L     2 Minute Post Oxygen Saturation %  97 %     2 Minute Post Liters of Oxygen 2 L             Oxygen Initial Assessment:  Oxygen Initial Assessment - 07/03/21 1411       Home Oxygen   Home Oxygen Device Home Concentrator;E-Tanks    Sleep Oxygen Prescription Continuous;BiPAP    Liters per minute 2    Home Exercise Oxygen Prescription Continuous    Liters per minute 2    Home Resting Oxygen Prescription Continuous    Liters per minute 2    Compliance with Home Oxygen Use Yes      Initial 6 min Walk   Oxygen Used Continuous    Liters per minute 2      Program Oxygen Prescription   Program Oxygen Prescription Continuous    Liters per minute 2      Intervention   Short Term Goals To learn and exhibit compliance with exercise,  home and travel O2 prescription;To learn and demonstrate proper pursed lip breathing techniques or other breathing techniques. ;To learn and understand importance of maintaining oxygen saturations>88%;To learn and understand importance of monitoring SPO2 with pulse oximeter and demonstrate accurate use of the pulse oximeter.;To learn and demonstrate proper use of respiratory medications    Long  Term Goals Exhibits compliance with exercise, home  and travel O2 prescription;Verbalizes importance of monitoring SPO2 with pulse oximeter and return demonstration;Maintenance of O2 saturations>88%;Exhibits proper breathing techniques, such as pursed lip breathing or other method taught during program session;Compliance with respiratory medication;Demonstrates proper use of MDI's             Oxygen Re-Evaluation:   Oxygen Discharge (Final Oxygen Re-Evaluation):   Initial Exercise Prescription:  Initial Exercise Prescription - 07/17/21 1100       Date of Initial Exercise RX and Referring Provider   Date 07/17/21    Referring Provider Marijean Bravo      Oxygen   Oxygen Continuous    Liters 2      Recumbant Bike   Level 1    RPM 60    Minutes 15    METs 1.7      NuStep   Level 1    SPM 80    Minutes 15    METs 1.7      Biostep-RELP   Level 1    SPM 50    Minutes 15    METs 1.7      Track   Laps 20    Minutes 15    METs 1.7      Prescription Details   Frequency (times per week) 3    Duration Progress to 30 minutes of continuous aerobic without signs/symptoms of physical distress      Intensity   THRR 40-80% of Max Heartrate 118-142    Ratings of Perceived Exertion 11-13    Perceived Dyspnea 0-4      Progression   Progression Continue to progress workloads to maintain intensity without signs/symptoms of physical distress.      Resistance Training   Training Prescription Yes    Weight 3 lb    Reps 10-15             Perform Capillary Blood Glucose checks as  needed.  Exercise Prescription Changes:   Exercise Prescription Changes     Row Name 07/17/21 1100             Response to Exercise   Blood Pressure (Admit) 112/68  Blood Pressure (Exercise) 118/60       Blood Pressure (Exit) 118/68       Heart Rate (Admit) 94 bpm       Heart Rate (Exercise) 101 bpm       Heart Rate (Exit) 95 bpm       Oxygen Saturation (Admit) 100 %       Oxygen Saturation (Exercise) 80 %       Oxygen Saturation (Exit) 91 %       Rating of Perceived Exertion (Exercise) 12       Perceived Dyspnea (Exercise) 2       Symptoms SOB                Exercise Comments:   Exercise Goals and Review:   Exercise Goals     Row Name 07/17/21 1155             Exercise Goals   Increase Physical Activity Yes       Intervention Provide advice, education, support and counseling about physical activity/exercise needs.;Develop an individualized exercise prescription for aerobic and resistive training based on initial evaluation findings, risk stratification, comorbidities and participant's personal goals.       Expected Outcomes Short Term: Attend rehab on a regular basis to increase amount of physical activity.;Long Term: Add in home exercise to make exercise part of routine and to increase amount of physical activity.;Long Term: Exercising regularly at least 3-5 days a week.       Increase Strength and Stamina Yes       Intervention Provide advice, education, support and counseling about physical activity/exercise needs.;Develop an individualized exercise prescription for aerobic and resistive training based on initial evaluation findings, risk stratification, comorbidities and participant's personal goals.       Expected Outcomes Short Term: Increase workloads from initial exercise prescription for resistance, speed, and METs.;Short Term: Perform resistance training exercises routinely during rehab and add in resistance training at home;Long Term: Improve  cardiorespiratory fitness, muscular endurance and strength as measured by increased METs and functional capacity (6MWT)       Able to understand and use rate of perceived exertion (RPE) scale Yes       Intervention Provide education and explanation on how to use RPE scale       Expected Outcomes Short Term: Able to use RPE daily in rehab to express subjective intensity level;Long Term:  Able to use RPE to guide intensity level when exercising independently       Able to understand and use Dyspnea scale Yes       Intervention Provide education and explanation on how to use Dyspnea scale       Expected Outcomes Short Term: Able to use Dyspnea scale daily in rehab to express subjective sense of shortness of breath during exertion;Long Term: Able to use Dyspnea scale to guide intensity level when exercising independently       Knowledge and understanding of Target Heart Rate Range (THRR) Yes       Intervention Provide education and explanation of THRR including how the numbers were predicted and where they are located for reference       Expected Outcomes Short Term: Able to state/look up THRR;Short Term: Able to use daily as guideline for intensity in rehab;Long Term: Able to use THRR to govern intensity when exercising independently       Able to check pulse independently Yes       Intervention Provide education and demonstration on  how to check pulse in carotid and radial arteries.;Review the importance of being able to check your own pulse for safety during independent exercise       Expected Outcomes Short Term: Able to explain why pulse checking is important during independent exercise;Long Term: Able to check pulse independently and accurately       Understanding of Exercise Prescription Yes       Intervention Provide education, explanation, and written materials on patient's individual exercise prescription       Expected Outcomes Short Term: Able to explain program exercise prescription;Long  Term: Able to explain home exercise prescription to exercise independently                Exercise Goals Re-Evaluation :   Discharge Exercise Prescription (Final Exercise Prescription Changes):  Exercise Prescription Changes - 07/17/21 1100       Response to Exercise   Blood Pressure (Admit) 112/68    Blood Pressure (Exercise) 118/60    Blood Pressure (Exit) 118/68    Heart Rate (Admit) 94 bpm    Heart Rate (Exercise) 101 bpm    Heart Rate (Exit) 95 bpm    Oxygen Saturation (Admit) 100 %    Oxygen Saturation (Exercise) 80 %    Oxygen Saturation (Exit) 91 %    Rating of Perceived Exertion (Exercise) 12    Perceived Dyspnea (Exercise) 2    Symptoms SOB             Nutrition:  Target Goals: Understanding of nutrition guidelines, daily intake of sodium '1500mg'$ , cholesterol '200mg'$ , calories 30% from fat and 7% or less from saturated fats, daily to have 5 or more servings of fruits and vegetables.  Education: All About Nutrition: -Group instruction provided by verbal, written material, interactive activities, discussions, models, and posters to present general guidelines for heart healthy nutrition including fat, fiber, MyPlate, the role of sodium in heart healthy nutrition, utilization of the nutrition label, and utilization of this knowledge for meal planning. Follow up email sent as well. Written material given at graduation.   Biometrics:  Pre Biometrics - 07/17/21 1156       Pre Biometrics   Height 5' 2.5" (1.588 m)    Weight 129 lb 4.8 oz (58.7 kg)    BMI (Calculated) 23.26    Single Leg Stand 1.6 seconds              Nutrition Therapy Plan and Nutrition Goals:   Nutrition Assessments:  MEDIFICTS Score Key: ?70 Need to make dietary changes  40-70 Heart Healthy Diet ? 40 Therapeutic Level Cholesterol Diet  Flowsheet Row Pulmonary Rehab from 07/17/2021 in [facility] Cardiac and Pulmonary Rehab  Picture Your Plate Total Score on Admission 45       Picture Your Plate Scores: [id number] Unhealthy dietary pattern with much room for improvement. 41-50 Dietary pattern unlikely to meet recommendations for good health and room for improvement. 51-60 More healthful dietary pattern, with some room for improvement.  >60 Healthy dietary pattern, although there may be some specific behaviors that could be improved.   Nutrition Goals Re-Evaluation:   Nutrition Goals Discharge (Final Nutrition Goals Re-Evaluation):   Psychosocial: Target Goals: Acknowledge presence or absence of significant depression and/or stress, maximize coping skills, provide positive support system. Participant is able to verbalize types and ability to use techniques and skills needed for reducing stress and depression.   Education: Stress, Anxiety, and Depression - Group verbal and visual presentation to define topics covered.  Reviews how  body is impacted by stress, anxiety, and depression.  Also discusses healthy ways to reduce stress and to treat/manage anxiety and depression.  Written material given at graduation.   Education: Sleep Hygiene -Provides group verbal and written instruction about how sleep can affect your health.  Define sleep hygiene, discuss sleep cycles and impact of sleep habits. Review good sleep hygiene tips.    Initial Review & Psychosocial Screening:  Initial Psych Review & Screening - 07/03/21 1417       Initial Review   Current issues with Current Sleep Concerns;Current Depression      Family Dynamics   Good Support System? Yes    Comments She gets depressed sometimes but generally she is not a depressed person. Deb lives with her daughter now and is her main source for support.      Barriers   Psychosocial barriers to participate in program The patient should benefit from training in stress management and relaxation.      Screening Interventions   Interventions Encouraged to exercise;To provide support and resources with identified  psychosocial needs;Provide feedback about the scores to participant    Expected Outcomes Short Term goal: Utilizing psychosocial counselor, staff and physician to assist with identification of specific Stressors or current issues interfering with healing process. Setting desired goal for each stressor or current issue identified.;Long Term Goal: Stressors or current issues are controlled or eliminated.;Short Term goal: Identification and review with participant of any Quality of Life or Depression concerns found by scoring the questionnaire.;Long Term goal: The participant improves quality of Life and PHQ9 Scores as seen by post scores and/or verbalization of changes             Quality of Life Scores:  Scores of 19 and below usually indicate a poorer quality of life in these areas.  A difference of  2-3 points is a clinically meaningful difference.  A difference of 2-3 points in the total score of the Quality of Life Index has been associated with significant improvement in overall quality of life, self-image, physical symptoms, and general health in studies assessing change in quality of life.  PHQ-9: Recent Review Flowsheet Data     Depression screen [facility] 2/9 07/17/2021 03/08/2021 01/11/2021 10/12/2020   Decreased Interest 0 0 0 0   Down, Depressed, Hopeless 0 0 0 0   PHQ - 2 Score 0 0 0 0   Altered sleeping 0 - - -   Tired, decreased energy 0 - - -   Change in appetite 0 - - -   Feeling bad or failure about yourself  0 - - -   Trouble concentrating 0 - - -   Moving slowly or fidgety/restless 0 - - -   Suicidal thoughts 0 - - -   PHQ-9 Score 0 - - -      Interpretation of Total Score  Total Score Depression Severity:  1-4 = Minimal depression, 5-9 = Mild depression, 10-14 = Moderate depression, 15-19 = Moderately severe depression, 20-27 = Severe depression   Psychosocial Evaluation and Intervention:  Psychosocial Evaluation - 07/03/21 1419       Psychosocial Evaluation &  Interventions   Interventions Encouraged to exercise with the program and follow exercise prescription;Relaxation education;Stress management education    Comments She gets depressed sometimes but generally she is not a depressed person. Deb lives with her daughter now and is her main source for support.    Expected Outcomes Short: Exercise regularly to support mental health and  notify staff of any changes. Long: maintain mental health and well being through teaching of rehab or prescribed medications independently.    Continue Psychosocial Services  Follow up required by staff             Psychosocial Re-Evaluation:   Psychosocial Discharge (Final Psychosocial Re-Evaluation):   Education: Education Goals: Education classes will be provided on a weekly basis, covering required topics. Participant will state understanding/return demonstration of topics presented.  Learning Barriers/Preferences:  Learning Barriers/Preferences - 07/03/21 1414       Learning Barriers/Preferences   Learning Barriers None    Learning Preferences None             General Pulmonary Education Topics:  Infection Prevention: - Provides verbal and written material to individual with discussion of infection control including proper hand washing and proper equipment cleaning during exercise session. Flowsheet Row Pulmonary Rehab from 07/17/2021 in [facility] Cardiac and Pulmonary Rehab  Date 07/17/21  Educator AS  Instruction Review Code 1- Verbalizes Understanding       Falls Prevention: - Provides verbal and written material to individual with discussion of falls prevention and safety. Flowsheet Row Pulmonary Rehab from 07/17/2021 in [facility] Cardiac and Pulmonary Rehab  Date 07/17/21  Educator AS  Instruction Review Code 1- Verbalizes Understanding       Chronic Lung Disease Review: - Group verbal instruction with posters, models, PowerPoint presentations and videos,  to review new updates, new  respiratory medications, new advancements in procedures and treatments. Providing information on websites and "800" numbers for continued self-education. Includes information about supplement oxygen, available portable oxygen systems, continuous and intermittent flow rates, oxygen safety, concentrators, and Medicare reimbursement for oxygen. Explanation of Pulmonary Drugs, including class, frequency, complications, importance of spacers, rinsing mouth after steroid MDI's, and proper cleaning methods for nebulizers. Review of basic lung anatomy and physiology related to function, structure, and complications of lung disease. Review of risk factors. Discussion about methods for diagnosing sleep apnea and types of masks and machines for OSA. Includes a review of the use of types of environmental controls: home humidity, furnaces, filters, dust mite/pet prevention, HEPA vacuums. Discussion about weather changes, air quality and the benefits of nasal washing. Instruction on Warning signs, infection symptoms, calling MD promptly, preventive modes, and value of vaccinations. Review of effective airway clearance, coughing and/or vibration techniques. Emphasizing that all should Create an Action Plan. Written material given at graduation.   AED/CPR: - Group verbal and written instruction with the use of models to demonstrate the basic use of the AED with the basic ABC's of resuscitation.    Anatomy and Cardiac Procedures: - Group verbal and visual presentation and models provide information about basic cardiac anatomy and function. Reviews the testing methods done to diagnose heart disease and the outcomes of the test results. Describes the treatment choices: Medical Management, Angioplasty, or Coronary Bypass Surgery for treating various heart conditions including Myocardial Infarction, Angina, Valve Disease, and Cardiac Arrhythmias.  Written material given at graduation.   Medication Safety: - Group verbal and  visual instruction to review commonly prescribed medications for heart and lung disease. Reviews the medication, class of the drug, and side effects. Includes the steps to properly store meds and maintain the prescription regimen.  Written material given at graduation.   Other: -Provides group and verbal instruction on various topics (see comments)   Knowledge Questionnaire Score:  Knowledge Questionnaire Score - 07/17/21 1131       Knowledge Questionnaire Score   Pre  Score 15/18  lung disease, oxygen              Core Components/Risk Factors/Patient Goals at Admission:  Personal Goals and Risk Factors at Admission - 07/17/21 1157       Core Components/Risk Factors/Patient Goals on Admission    Weight Management Yes;Weight Maintenance    Intervention Weight Management: Develop a combined nutrition and exercise program designed to reach desired caloric intake, while maintaining appropriate intake of nutrient and fiber, sodium and fats, and appropriate energy expenditure required for the weight goal.;Weight Management: Provide education and appropriate resources to help participant work on and attain dietary goals.;Weight Management/Obesity: Establish reasonable short term and long term weight goals.    Expected Outcomes Short Term: Continue to assess and modify interventions until short term weight is achieved;Long Term: Adherence to nutrition and physical activity/exercise program aimed toward attainment of established weight goal;Weight Maintenance: Understanding of the daily nutrition guidelines, which includes 25-35% calories from fat, 7% or less cal from saturated fats, less than '200mg'$  cholesterol, less than 1.5gm of sodium, & 5 or more servings of fruits and vegetables daily;Understanding recommendations for meals to include 15-35% energy as protein, 25-35% energy from fat, 35-60% energy from carbohydrates, less than '200mg'$  of dietary cholesterol, 20-35 gm of total fiber  daily;Understanding of distribution of calorie intake throughout the day with the consumption of 4-5 meals/snacks    Improve shortness of breath with ADL's Yes    Intervention Provide education, individualized exercise plan and daily activity instruction to help decrease symptoms of SOB with activities of daily living.    Expected Outcomes Short Term: Improve cardiorespiratory fitness to achieve a reduction of symptoms when performing ADLs;Long Term: Be able to perform more ADLs without symptoms or delay the onset of symptoms    Increase knowledge of respiratory medications and ability to use respiratory devices properly  Yes    Intervention Provide education and demonstration as needed of appropriate use of medications, inhalers, and oxygen therapy.    Expected Outcomes Short Term: Achieves understanding of medications use. Understands that oxygen is a medication prescribed by physician. Demonstrates appropriate use of inhaler and oxygen therapy.;Long Term: Maintain appropriate use of medications, inhalers, and oxygen therapy.    Heart Failure Yes    Intervention Provide a combined exercise and nutrition program that is supplemented with education, support and counseling about heart failure. Directed toward relieving symptoms such as shortness of breath, decreased exercise tolerance, and extremity edema.    Expected Outcomes Improve functional capacity of life;Short term: Attendance in program 2-3 days a week with increased exercise capacity. Reported lower sodium intake. Reported increased fruit and vegetable intake. Reports medication compliance.;Short term: Daily weights obtained and reported for increase. Utilizing diuretic protocols set by physician.;Long term: Adoption of self-care skills and reduction of barriers for early signs and symptoms recognition and intervention leading to self-care maintenance.    Lipids Yes    Intervention Provide education and support for participant on nutrition &  aerobic/resistive exercise along with prescribed medications to achieve LDL '70mg'$ , HDL >'40mg'$ .    Expected Outcomes Short Term: Participant states understanding of desired cholesterol values and is compliant with medications prescribed. Participant is following exercise prescription and nutrition guidelines.;Long Term: Cholesterol controlled with medications as prescribed, with individualized exercise RX and with personalized nutrition plan. Value goals: LDL < '70mg'$ , HDL > 40 mg.             Education:Diabetes - Individual verbal and written instruction to review signs/symptoms of diabetes,  desired ranges of glucose level fasting, after meals and with exercise. Acknowledge that pre and post exercise glucose checks will be done for 3 sessions at entry of program.   Know Your Numbers and Heart Failure: - Group verbal and visual instruction to discuss disease risk factors for cardiac and pulmonary disease and treatment options.  Reviews associated critical values for Overweight/Obesity, Hypertension, Cholesterol, and Diabetes.  Discusses basics of heart failure: signs/symptoms and treatments.  Introduces Heart Failure Zone chart for action plan for heart failure.  Written material given at graduation.   Core Components/Risk Factors/Patient Goals Review:    Core Components/Risk Factors/Patient Goals at Discharge (Final Review):    ITP Comments:  ITP Comments     Row Name 07/03/21 1421 07/17/21 1208 07/24/21 1123 08/09/21 0640 08/09/21 1715   ITP Comments Virtual Visit completed. Patient informed on EP and RD appointment and 6 Minute walk test. Patient also informed of patient health questionnaires on My Chart. Patient Verbalizes understanding. Visit diagnosis can be found in [facility] 06/13/2021. Completed 6MWT and gym orientation. Initial ITP created and sent for review to Dr Ottie Glazier, Medical Director. Patient called out the next couple of weeks. She was in the ED yesterday for COPD  exacerbation and has a couple doctors appointments in the next week. Patient needs to see PCP first before coming back. They will call and keep [place] updated. 30 Day review completed. Medical Director ITP review done, changes made as directed, and signed approval by Medical Director. Patient is out from rehab, she has not seen her doctor yet for follow up and is getting clearance to come back. Out tentatively until 9/12 or TBD            Comments: discharge ITP

## 2021-08-28 NOTE — Telephone Encounter (Signed)
Spoke with Deborahs relative - she passed away 2 days ago - he had questions about her blood sugar and he was advised to speak with her PCP

## 2021-08-30 ENCOUNTER — Ambulatory Visit: Payer: Medicare Other

## 2021-09-02 DEATH — deceased

## 2021-09-04 ENCOUNTER — Ambulatory Visit: Payer: Medicare Other

## 2021-09-06 ENCOUNTER — Ambulatory Visit: Payer: Medicare Other

## 2021-09-11 ENCOUNTER — Ambulatory Visit: Payer: Medicare Other

## 2021-09-13 ENCOUNTER — Ambulatory Visit: Payer: Medicare Other

## 2021-09-18 ENCOUNTER — Ambulatory Visit: Payer: Medicare Other

## 2021-09-20 ENCOUNTER — Ambulatory Visit: Payer: Medicare Other

## 2021-09-25 ENCOUNTER — Ambulatory Visit: Payer: Medicare Other

## 2021-09-27 ENCOUNTER — Ambulatory Visit: Payer: Medicare Other

## 2021-10-02 ENCOUNTER — Ambulatory Visit: Payer: Medicare Other

## 2021-10-04 ENCOUNTER — Ambulatory Visit: Payer: Medicare Other

## 2021-10-09 ENCOUNTER — Ambulatory Visit: Payer: Medicare Other

## 2021-10-11 ENCOUNTER — Ambulatory Visit: Payer: Medicare Other

## 2021-10-16 ENCOUNTER — Ambulatory Visit: Payer: Medicare Other

## 2021-10-18 ENCOUNTER — Ambulatory Visit: Payer: Medicare Other

## 2021-10-23 ENCOUNTER — Ambulatory Visit: Payer: Medicare Other

## 2021-10-25 ENCOUNTER — Ambulatory Visit: Payer: Medicare Other

## 2021-10-30 ENCOUNTER — Ambulatory Visit: Payer: Medicare Other

## 2021-11-01 ENCOUNTER — Ambulatory Visit: Payer: Medicare Other

## 2021-11-06 ENCOUNTER — Ambulatory Visit: Payer: Medicare Other

## 2021-11-08 ENCOUNTER — Ambulatory Visit: Payer: Medicare Other

## 2021-11-08 ENCOUNTER — Encounter: Payer: Medicare Other | Admitting: Student in an Organized Health Care Education/Training Program

## 2021-11-13 ENCOUNTER — Ambulatory Visit: Payer: Medicare Other

## 2021-11-15 ENCOUNTER — Ambulatory Visit: Payer: Medicare Other
